# Patient Record
Sex: Male | Born: 1960 | Race: White | Hispanic: No | Marital: Married | Smoking: Never smoker
Health system: Southern US, Community
[De-identification: ages and names within clinical notes are randomized; demographics above are authoritative.]

## PROBLEM LIST (undated history)

## (undated) DIAGNOSIS — E785 Hyperlipidemia, unspecified: Secondary | ICD-10-CM

## (undated) DIAGNOSIS — N200 Calculus of kidney: Secondary | ICD-10-CM

## (undated) DIAGNOSIS — M1990 Unspecified osteoarthritis, unspecified site: Secondary | ICD-10-CM

## (undated) DIAGNOSIS — Z9289 Personal history of other medical treatment: Secondary | ICD-10-CM

## (undated) HISTORY — DX: Hyperlipidemia, unspecified: E78.5

## (undated) HISTORY — DX: Unspecified osteoarthritis, unspecified site: M19.90

## (undated) HISTORY — PX: OTHER SURGICAL HISTORY: SHX169

---

## 2007-06-21 ENCOUNTER — Inpatient Hospital Stay (HOSPITAL_COMMUNITY): Admission: EM | Admit: 2007-06-21 | Discharge: 2007-07-06 | Payer: Self-pay | Admitting: Emergency Medicine

## 2007-06-24 ENCOUNTER — Ambulatory Visit: Payer: Self-pay | Admitting: Physical Medicine & Rehabilitation

## 2008-01-27 ENCOUNTER — Encounter: Payer: Self-pay | Admitting: Internal Medicine

## 2008-02-09 ENCOUNTER — Ambulatory Visit: Payer: Self-pay | Admitting: Internal Medicine

## 2008-02-09 DIAGNOSIS — L03211 Cellulitis of face: Secondary | ICD-10-CM

## 2008-02-09 DIAGNOSIS — L0201 Cutaneous abscess of face: Secondary | ICD-10-CM

## 2008-02-09 DIAGNOSIS — F329 Major depressive disorder, single episode, unspecified: Secondary | ICD-10-CM

## 2008-02-20 ENCOUNTER — Ambulatory Visit: Payer: Self-pay | Admitting: Internal Medicine

## 2008-05-21 ENCOUNTER — Encounter: Admission: RE | Admit: 2008-05-21 | Discharge: 2008-05-21 | Payer: Self-pay | Admitting: Orthopedic Surgery

## 2008-06-14 ENCOUNTER — Ambulatory Visit: Payer: Self-pay | Admitting: *Deleted

## 2008-09-14 ENCOUNTER — Encounter: Admission: RE | Admit: 2008-09-14 | Discharge: 2008-09-14 | Payer: Self-pay | Admitting: Family Medicine
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
## 2010-10-16 LAB — TYPE AND SCREEN
ABO/RH(D): O POS
Antibody Screen: NEGATIVE

## 2010-10-16 LAB — POCT I-STAT 7, (LYTES, BLD GAS, ICA,H+H)
Calcium, Ion: 1.14
O2 Saturation: 100
Patient temperature: 36.5
Patient temperature: 37
Potassium: 4.1
Potassium: 4.4
TCO2: 26
TCO2: 28
pCO2 arterial: 44.2
pH, Arterial: 7.406
pO2, Arterial: 340 — ABNORMAL HIGH

## 2010-10-16 LAB — BASIC METABOLIC PANEL
BUN: 16
BUN: 16
CO2: 26
CO2: 27
CO2: 27
Calcium: 7.6 — ABNORMAL LOW
Calcium: 7.8 — ABNORMAL LOW
Calcium: 7.9 — ABNORMAL LOW
Calcium: 8.1 — ABNORMAL LOW
Calcium: 8.5
Calcium: 8.6
Chloride: 110
Chloride: 115 — ABNORMAL HIGH
Chloride: 101
Chloride: 103
Creatinine, Ser: 0.76
Creatinine, Ser: 0.86
Creatinine, Ser: 1
Creatinine, Ser: 1.14
GFR calc Af Amer: >60
GFR calc Af Amer: >60
GFR calc Af Amer: >60
GFR calc Af Amer: >60
GFR calc Af Amer: >60
GFR calc non Af Amer: >60
GFR calc non Af Amer: >60
GFR calc non Af Amer: >60
GFR calc non Af Amer: >60
GFR calc non Af Amer: >60
GFR calc non Af Amer: >60
Glucose, Bld: 104 — ABNORMAL HIGH
Glucose, Bld: 105 — ABNORMAL HIGH
Glucose, Bld: 107 — ABNORMAL HIGH
Glucose, Bld: 115 — ABNORMAL HIGH
Glucose, Bld: 113 — ABNORMAL HIGH
Glucose, Bld: 117 — ABNORMAL HIGH
Glucose, Bld: 118 — ABNORMAL HIGH
Potassium: 3.6
Potassium: 3.7
Potassium: 4.2
Potassium: 4.5
Potassium: 3.9
Potassium: 5
Sodium: 138
Sodium: 141
Sodium: 141
Sodium: 141
Sodium: 142
Sodium: 144
Sodium: 135
Sodium: 138

## 2010-10-16 LAB — CBC
HCT: 22 — ABNORMAL LOW
HCT: 26.8 — ABNORMAL LOW
HCT: 27.3 — ABNORMAL LOW
HCT: 30.2 — ABNORMAL LOW
HCT: 36.3 — ABNORMAL LOW
HCT: 43.5
HCT: 29.6 — ABNORMAL LOW
HCT: 29.7 — ABNORMAL LOW
Hemoglobin: 10.6 — ABNORMAL LOW
Hemoglobin: 11.1 — ABNORMAL LOW
Hemoglobin: 13.1
Hemoglobin: 15.1
Hemoglobin: 8.6 — ABNORMAL LOW
Hemoglobin: 8.7 — ABNORMAL LOW
Hemoglobin: 9.6 — ABNORMAL LOW
Hemoglobin: 9.9 — ABNORMAL LOW
Hemoglobin: 10.4 — ABNORMAL LOW
Hemoglobin: 10.6 — ABNORMAL LOW
MCHC: 34.7
MCHC: 34.8
MCHC: 34.8
MCHC: 34.9
MCHC: 35.7
MCHC: 36
MCHC: 34.3
MCV: 87.7
MCV: 87.7
MCV: 88.5
MCV: 88.5
MCV: 89
MCV: 89.2
MCV: 86.8
Platelets: 243
Platelets: 253
Platelets: 326
Platelets: 439 — ABNORMAL HIGH
Platelets: 507 — ABNORMAL HIGH
Platelets: 557 — ABNORMAL HIGH
Platelets: 562 — ABNORMAL HIGH
Platelets: 657 — ABNORMAL HIGH
Platelets: 701 — ABNORMAL HIGH
RBC: 2.65 — ABNORMAL LOW
RBC: 2.82 — ABNORMAL LOW
RBC: 2.96 — ABNORMAL LOW
RBC: 3 — ABNORMAL LOW
RBC: 3.08 — ABNORMAL LOW
RBC: 3.42 — ABNORMAL LOW
RBC: 3.72 — ABNORMAL LOW
RBC: 4.14 — ABNORMAL LOW
RBC: 4.88
RDW: 13.1
RDW: 13.1
RDW: 13.2
RDW: 13.2
RDW: 13.3
RDW: 13.6
RDW: 14.4
RDW: 14.7
RDW: 14.9
WBC: 10
WBC: 11.7 — ABNORMAL HIGH
WBC: 14.1 — ABNORMAL HIGH
WBC: 15.1 — ABNORMAL HIGH
WBC: 16.2 — ABNORMAL HIGH
WBC: 20 — ABNORMAL HIGH
WBC: 9.7
WBC: 16.9 — ABNORMAL HIGH
WBC: 17.5 — ABNORMAL HIGH

## 2010-10-16 LAB — CROSSMATCH: ABO/RH(D): O POS

## 2010-10-16 LAB — URINALYSIS, ROUTINE W REFLEX MICROSCOPIC
Bilirubin Urine: NEGATIVE
Nitrite: NEGATIVE
Specific Gravity, Urine: 1.024
Urobilinogen, UA: 1
pH: 6

## 2010-10-16 LAB — PROTIME-INR
INR: 1
INR: 1.1
INR: 1.1
Prothrombin Time: 13.5
Prothrombin Time: 14.5

## 2010-10-16 LAB — APTT: aPTT: 30

## 2010-10-16 LAB — COMPREHENSIVE METABOLIC PANEL
ALT: 81 — ABNORMAL HIGH
Alkaline Phosphatase: 103
BUN: 13
CO2: 26
Chloride: 106
Glucose, Bld: 111 — ABNORMAL HIGH
Potassium: 3.9
Sodium: 138
Total Bilirubin: 1.5 — ABNORMAL HIGH

## 2010-10-16 LAB — URINE MICROSCOPIC-ADD ON

## 2010-10-16 LAB — PREALBUMIN: Prealbumin: 11.6 — ABNORMAL LOW

## 2010-10-16 LAB — ABO/RH: ABO/RH(D): O POS

## 2014-11-10 ENCOUNTER — Encounter (HOSPITAL_COMMUNITY): Payer: Self-pay

## 2014-11-10 ENCOUNTER — Emergency Department (HOSPITAL_COMMUNITY)
Admission: EM | Admit: 2014-11-10 | Discharge: 2014-11-10 | Disposition: A | Discharge status: Home / Self Care | Payer: Medicare Other | Attending: Emergency Medicine | Admitting: Emergency Medicine

## 2014-11-10 ENCOUNTER — Emergency Department (HOSPITAL_COMMUNITY): Payer: Medicare Other

## 2014-11-10 DIAGNOSIS — Z7982 Long term (current) use of aspirin: Secondary | ICD-10-CM | POA: Insufficient documentation

## 2014-11-10 DIAGNOSIS — N1330 Unspecified hydronephrosis: Secondary | ICD-10-CM | POA: Diagnosis not present

## 2014-11-10 DIAGNOSIS — R110 Nausea: Secondary | ICD-10-CM | POA: Insufficient documentation

## 2014-11-10 DIAGNOSIS — N210 Calculus in bladder: Secondary | ICD-10-CM | POA: Diagnosis not present

## 2014-11-10 DIAGNOSIS — N200 Calculus of kidney: Secondary | ICD-10-CM | POA: Diagnosis not present

## 2014-11-10 DIAGNOSIS — Z79899 Other long term (current) drug therapy: Secondary | ICD-10-CM | POA: Diagnosis not present

## 2014-11-10 DIAGNOSIS — Z8659 Personal history of other mental and behavioral disorders: Secondary | ICD-10-CM | POA: Insufficient documentation

## 2014-11-10 DIAGNOSIS — R109 Unspecified abdominal pain: Secondary | ICD-10-CM | POA: Diagnosis present

## 2014-11-10 LAB — COMPREHENSIVE METABOLIC PANEL
ALK PHOS: 82 U/L (ref 38–126)
ALT: 31 U/L (ref 17–63)
AST: 29 U/L (ref 15–41)
Albumin: 4 g/dL (ref 3.5–5.0)
Anion gap: 16 — ABNORMAL HIGH (ref 5–15)
BILIRUBIN TOTAL: 1 mg/dL (ref 0.3–1.2)
BUN: 16 mg/dL (ref 6–20)
CALCIUM: 10 mg/dL (ref 8.9–10.3)
CO2: 20 mmol/L — AB (ref 22–32)
CREATININE: 1.23 mg/dL (ref 0.61–1.24)
Chloride: 106 mmol/L (ref 101–111)
Glucose, Bld: 146 mg/dL — ABNORMAL HIGH (ref 65–99)
Potassium: 3.8 mmol/L (ref 3.5–5.1)
Sodium: 142 mmol/L (ref 135–145)
TOTAL PROTEIN: 7.8 g/dL (ref 6.5–8.1)

## 2014-11-10 LAB — URINALYSIS, ROUTINE W REFLEX MICROSCOPIC
Glucose, UA: NEGATIVE mg/dL
Ketones, ur: 15 mg/dL — AB
Leukocytes, UA: NEGATIVE
Nitrite: NEGATIVE
PROTEIN: 30 mg/dL — AB
SPECIFIC GRAVITY, URINE: 1.026 (ref 1.005–1.030)
UROBILINOGEN UA: 1 mg/dL (ref 0.0–1.0)
pH: 5 (ref 5.0–8.0)

## 2014-11-10 LAB — URINE MICROSCOPIC-ADD ON

## 2014-11-10 LAB — CBC WITH DIFFERENTIAL/PLATELET
Basophils Absolute: 0 10*3/uL (ref 0.0–0.1)
Basophils Relative: 0 %
Eosinophils Absolute: 0.1 10*3/uL (ref 0.0–0.7)
Eosinophils Relative: 0 %
HEMATOCRIT: 47.3 % (ref 39.0–52.0)
HEMOGLOBIN: 16 g/dL (ref 13.0–17.0)
LYMPHS ABS: 1.4 10*3/uL (ref 0.7–4.0)
LYMPHS PCT: 10 %
MCH: 30.4 pg (ref 26.0–34.0)
MCHC: 33.8 g/dL (ref 30.0–36.0)
MCV: 89.9 fL (ref 78.0–100.0)
MONOS PCT: 6 %
Monocytes Absolute: 0.8 10*3/uL (ref 0.1–1.0)
NEUTROS PCT: 84 %
Neutro Abs: 11.3 10*3/uL — ABNORMAL HIGH (ref 1.7–7.7)
Platelets: 307 10*3/uL (ref 150–400)
RBC: 5.26 MIL/uL (ref 4.22–5.81)
RDW: 13.3 % (ref 11.5–15.5)
WBC: 13.5 10*3/uL — AB (ref 4.0–10.5)

## 2014-11-10 MED ORDER — HYDROMORPHONE HCL 1 MG/ML IJ SOLN
1.0000 mg | Freq: Once | INTRAMUSCULAR | Status: AC
  Administered 2014-11-10: 1 mg via INTRAVENOUS
  Filled 2014-11-10: qty 1

## 2014-11-10 MED ORDER — ONDANSETRON HCL 4 MG/2ML IJ SOLN
4.0000 mg | Freq: Once | INTRAMUSCULAR | Status: AC
  Administered 2014-11-10: 4 mg via INTRAVENOUS
  Filled 2014-11-10: qty 2

## 2014-11-10 MED ORDER — HYDROMORPHONE HCL 1 MG/ML IJ SOLN
0.5000 mg | Freq: Once | INTRAMUSCULAR | Status: AC
Start: 2014-11-10 — End: 2014-11-10
  Administered 2014-11-10: 0.5 mg via INTRAVENOUS
  Filled 2014-11-10: qty 1

## 2014-11-10 MED ORDER — SODIUM CHLORIDE 0.9 % IV BOLUS (SEPSIS)
1000.0000 mL | Freq: Once | INTRAVENOUS | Status: AC
Start: 2014-11-10 — End: 2014-11-10
  Administered 2014-11-10: 1000 mL via INTRAVENOUS

## 2014-11-10 MED ORDER — KETOROLAC TROMETHAMINE 30 MG/ML IJ SOLN
30.0000 mg | Freq: Once | INTRAMUSCULAR | Status: AC
  Administered 2014-11-10: 30 mg via INTRAVENOUS
  Filled 2014-11-10: qty 1

## 2014-11-10 MED ORDER — TAMSULOSIN HCL 0.4 MG PO CAPS
0.4000 mg | ORAL_CAPSULE | Freq: Every day | ORAL | Status: DC
Start: 2014-11-10 — End: 2016-03-02

## 2014-11-10 MED ORDER — OXYCODONE-ACETAMINOPHEN 5-325 MG PO TABS: 1.0000 | ORAL_TABLET | ORAL | Status: DC | PRN

## 2014-11-10 MED ORDER — OXYCODONE-ACETAMINOPHEN 5-325 MG PO TABS
1.0000 | ORAL_TABLET | Freq: Once | ORAL | Status: AC
  Administered 2014-11-10: 1 via ORAL
  Filled 2014-11-10: qty 1

## 2014-11-10 NOTE — ED Provider Notes (Signed)
CSN: [id number]     Arrival date & time 11/10/14  0609 History   First MD Initiated Contact with Patient 11/10/14 [phone]     Chief Complaint  Patient presents with  . Flank Pain     (Consider location/radiation/quality/duration/timing/severity/associated sxs/prior Treatment) HPI Patient presents with acute onset right flank pain radiating to the right side of the abdomen with onset at 3 AM on 10/21. Sates the pain has been episodic since then. Worsened this morning. Associated with nausea. Denies any fever or chills. Denies hematuria, urgency, frequency or dysuria. No previously similar symptoms. No family history of kidney stones. Past Medical History  Diagnosis Date  . Depression     hx of   . Kidney stone    Past Surgical History  Procedure Laterality Date  . Right arm fracture srugery     . Right wrist surgery     . Broken pelvis surgery       pin in middle to connect both sides   . Left heel surgery     . Left heel sugery       to remove screws    No family history on file. Social History  Substance Use Topics  . Smoking status: Never Smoker   . Smokeless tobacco: Never Used  . Alcohol Use: No    Review of Systems  Constitutional: Negative for fever and chills.  Respiratory: Negative for shortness of breath.   Cardiovascular: Negative for chest pain.  Gastrointestinal: Positive for nausea and abdominal pain. Negative for diarrhea and constipation.  Genitourinary: Positive for flank pain. Negative for dysuria, frequency, hematuria and difficulty urinating.  Musculoskeletal: Positive for back pain. Negative for neck pain and neck stiffness.  Skin: Negative for rash and wound.  Neurological: Negative for dizziness, weakness, light-headedness, numbness and headaches.  All other systems reviewed and are negative.     Allergies  Review of patient's allergies indicates no known allergies.  Home Medications   Prior to Admission medications   Medication Sig Start  Date End Date Taking? Authorizing Provider  aspirin EC 325 MG tablet Take 325 mg by mouth daily as needed (pain).    Historical Provider, MD  oxyCODONE-acetaminophen (PERCOCET/ROXICET) 5-325 MG tablet Take 1-2 tablets by mouth every 4 (four) hours as needed for severe pain. 11/10/14   Blanchie Dessert, MD  tamsulosin (FLOMAX) 0.4 MG CAPS capsule Take 1 capsule (0.4 mg total) by mouth daily after supper. 11/10/14   Blanchie Dessert, MD   BP 123/72 mmHg  Pulse 64  Temp(Src) 97.9 F (36.6 C) (Oral)  Resp 17  SpO2 97% Physical Exam  Constitutional: He is oriented to person, place, and time. He appears well-developed and well-nourished. He appears distressed.  HENT:  Head: Normocephalic and atraumatic.  Mouth/Throat: Oropharynx is clear and moist.  Eyes: EOM are normal. Pupils are equal, round, and reactive to light.  Neck: Normal range of motion. Neck supple.  Cardiovascular: Normal rate and regular rhythm.   Pulmonary/Chest: Effort normal and breath sounds normal. No respiratory distress. He has no wheezes. He has no rales.  Abdominal: Soft. Bowel sounds are normal. He exhibits no distension and no mass. There is no tenderness. There is no rebound and no guarding.  Musculoskeletal: Normal range of motion. He exhibits no edema or tenderness.  No CVA tenderness bilaterally.  Neurological: He is alert and oriented to person, place, and time.  Skin: Skin is warm and dry. No rash noted. No erythema.  Psychiatric: He has a normal mood and  affect. His behavior is normal.  Nursing note and vitals reviewed.   ED Course  Procedures (including critical care time) Labs Review Labs Reviewed  CBC WITH DIFFERENTIAL/PLATELET - Abnormal; Notable for the following:    WBC 13.5 (*)    Neutro Abs 11.3 (*)    All other components within normal limits  COMPREHENSIVE METABOLIC PANEL - Abnormal; Notable for the following:    CO2 20 (*)    Glucose, Bld 146 (*)    Anion gap 16 (*)    All other components  within normal limits  URINALYSIS, ROUTINE W REFLEX MICROSCOPIC (NOT AT [facility]) - Abnormal; Notable for the following:    Color, Urine AMBER (*)    APPearance TURBID (*)    Hgb urine dipstick LARGE (*)    Bilirubin Urine SMALL (*)    Ketones, ur 15 (*)    Protein, ur 30 (*)    All other components within normal limits  URINE MICROSCOPIC-ADD ON - Abnormal; Notable for the following:    Squamous Epithelial / LPF FEW (*)    All other components within normal limits    Imaging Review No results found. I have personally reviewed and evaluated these images and lab results as part of my medical decision-making.   EKG Interpretation None      MDM   Final diagnoses:  Kidney stone  Bladder stones    Pt presents with acute onset right flank pain associated with nausea. Consistent with renal stone. Will get CT without contrast and basic labs. Will attempt to control pain in the emergency department.  Signed out to oncoming emergency physician pending imaging and pain control.  Julianne Rice, MD 11/13/14 [phone]

## 2014-11-10 NOTE — ED Notes (Signed)
Pt c/o right-sided flank pain and nausea beginning 10/21 morning, denies burning w/ urination or blood in urine. No hx kidney stones.

## 2014-11-10 NOTE — ED Notes (Signed)
Patient transported to CT 

## 2014-11-10 NOTE — Discharge Instructions (Signed)
Kidney Stones °Kidney stones (urolithiasis) are deposits that form inside your kidneys. The intense pain is caused by the stone moving through the urinary tract. When the stone moves, the ureter goes into spasm around the stone. The stone is usually passed in the urine.  °CAUSES  °· A disorder that makes certain neck glands produce too much parathyroid hormone (primary hyperparathyroidism). °· A buildup of uric acid crystals, similar to gout in your joints. °· Narrowing (stricture) of the ureter. °· A kidney obstruction present at birth (congenital obstruction). °· Previous surgery on the kidney or ureters. °· Numerous kidney infections. °SYMPTOMS  °· Feeling sick to your stomach (nauseous). °· Throwing up (vomiting). °· Blood in the urine (hematuria). °· Pain that usually spreads (radiates) to the groin. °· Frequency or urgency of urination. °DIAGNOSIS  °· Taking a history and physical exam. °· Blood or urine tests. °· CT scan. °· Occasionally, an examination of the inside of the urinary bladder (cystoscopy) is performed. °TREATMENT  °· Observation. °· Increasing your fluid intake. °· Extracorporeal shock wave lithotripsy--This is a noninvasive procedure that uses shock waves to break up kidney stones. °· Surgery may be needed if you have severe pain or persistent obstruction. There are various surgical procedures. Most of the procedures are performed with the use of small instruments. Only small incisions are needed to accommodate these instruments, so recovery time is minimized. °The size, location, and chemical composition are all important variables that will determine the proper choice of action for you. Talk to your health care provider to better understand your situation so that you will minimize the risk of injury to yourself and your kidney.  °HOME CARE INSTRUCTIONS  °· Drink enough water and fluids to keep your urine clear or pale yellow. This will help you to pass the stone or stone fragments. °· Strain  all urine through the provided strainer. Keep all particulate matter and stones for your health care provider to see. The stone causing the pain may be as small as a grain of salt. It is very important to use the strainer each and every time you pass your urine. The collection of your stone will allow your health care provider to analyze it and verify that a stone has actually passed. The stone analysis will often identify what you can do to reduce the incidence of recurrences. °· Only take over-the-counter or prescription medicines for pain, discomfort, or fever as directed by your health care provider. °· Keep all follow-up visits as told by your health care provider. This is important. °· Get follow-up X-rays if required. The absence of pain does not always mean that the stone has passed. It may have only stopped moving. If the urine remains completely obstructed, it can cause loss of kidney function or even complete destruction of the kidney. It is your responsibility to make sure X-rays and follow-ups are completed. Ultrasounds of the kidney can show blockages and the status of the kidney. Ultrasounds are not associated with any radiation and can be performed easily in a matter of minutes. °· Make changes to your daily diet as told by your health care provider. You may be told to: °¨ Limit the amount of salt that you eat. °¨ Eat 5 or more servings of fruits and vegetables each day. °¨ Limit the amount of meat, poultry, fish, and eggs that you eat. °· Collect a 24-hour urine sample as told by your health care provider. You may need to collect another urine sample every 6-12   months. °SEEK MEDICAL CARE IF: °· You experience pain that is progressive and unresponsive to any pain medicine you have been prescribed. °SEEK IMMEDIATE MEDICAL CARE IF:  °· Pain cannot be controlled with the prescribed medicine. °· You have a fever or shaking chills. °· The severity or intensity of pain increases over 18 hours and is not  relieved by pain medicine. °· You develop a new onset of abdominal pain. °· You feel faint or pass out. °· You are unable to urinate. °  °This information is not intended to replace advice given to you by your health care provider. Make sure you discuss any questions you have with your health care provider. °  °Document Released: 01/05/2005 Document Revised: 09/26/2014 Document Reviewed: 06/08/2012 °Elsevier Interactive Patient Education ©2016 [employer] ° °

## 2014-11-10 NOTE — ED Provider Notes (Signed)
8:11 AM Pt with CT that showed 2 right sided stones in the ureter.  58mm distal and 68mm proximal with mild hydronephrosis and well as multiple bladder stones.  Pt has normal renal function and mild leukocytosis which is most likely acute phase reaction and UA that is pending. On re-eval pt's pain is improved but requesting some more meds.  Will give second dose of dilaudid and percocet.  If pain controlled discussing going home with flomax and pain control, using strainer and f/u with urology  10:22 AM UA without signs of infection. Patient's pain is controlled and he was discharged home.  Blanchie Dessert, MD 11/10/14 1023

## 2014-11-13 ENCOUNTER — Encounter (HOSPITAL_COMMUNITY): Payer: Self-pay | Admitting: *Deleted

## 2014-11-13 ENCOUNTER — Other Ambulatory Visit: Payer: Self-pay | Admitting: Urology

## 2014-11-13 DIAGNOSIS — N200 Calculus of kidney: Secondary | ICD-10-CM | POA: Diagnosis not present

## 2014-11-15 ENCOUNTER — Encounter (HOSPITAL_COMMUNITY): Payer: Self-pay

## 2014-11-15 ENCOUNTER — Ambulatory Visit (HOSPITAL_COMMUNITY): Payer: Medicare Other | Admitting: Anesthesiology

## 2014-11-15 ENCOUNTER — Encounter (HOSPITAL_COMMUNITY)
Admission: RE | Disposition: A | Discharge status: Home / Self Care | Payer: Self-pay | Source: Ambulatory Visit | Attending: Urology

## 2014-11-15 ENCOUNTER — Ambulatory Visit (HOSPITAL_COMMUNITY)
Admission: RE | Admit: 2014-11-15 | Discharge: 2014-11-15 | Disposition: A | Discharge status: Home / Self Care | Payer: Medicare Other | Source: Ambulatory Visit | Attending: Urology | Admitting: Urology

## 2014-11-15 DIAGNOSIS — N132 Hydronephrosis with renal and ureteral calculous obstruction: Secondary | ICD-10-CM | POA: Insufficient documentation

## 2014-11-15 DIAGNOSIS — Z87442 Personal history of urinary calculi: Secondary | ICD-10-CM | POA: Diagnosis not present

## 2014-11-15 DIAGNOSIS — N200 Calculus of kidney: Secondary | ICD-10-CM | POA: Diagnosis not present

## 2014-11-15 DIAGNOSIS — N210 Calculus in bladder: Secondary | ICD-10-CM | POA: Insufficient documentation

## 2014-11-15 DIAGNOSIS — Z6832 Body mass index (BMI) 32.0-32.9, adult: Secondary | ICD-10-CM | POA: Diagnosis not present

## 2014-11-15 DIAGNOSIS — N201 Calculus of ureter: Secondary | ICD-10-CM | POA: Diagnosis not present

## 2014-11-15 DIAGNOSIS — N359 Urethral stricture, unspecified: Secondary | ICD-10-CM | POA: Insufficient documentation

## 2014-11-15 DIAGNOSIS — E669 Obesity, unspecified: Secondary | ICD-10-CM | POA: Insufficient documentation

## 2014-11-15 HISTORY — PX: CYSTOSCOPY WITH RETROGRADE PYELOGRAM, URETEROSCOPY AND STENT PLACEMENT: SHX5789

## 2014-11-15 HISTORY — DX: Calculus of kidney: N20.0

## 2014-11-15 SURGERY — CYSTOURETEROSCOPY, WITH RETROGRADE PYELOGRAM AND STENT INSERTION
Anesthesia: General | Site: Ureter | Laterality: Right

## 2014-11-15 MED ORDER — FENTANYL CITRATE (PF) 250 MCG/5ML IJ SOLN
INTRAMUSCULAR | Status: AC
  Filled 2014-11-15: qty 25

## 2014-11-15 MED ORDER — SODIUM CHLORIDE 0.9 % IR SOLN
Status: DC | PRN
  Administered 2014-11-15: 4000 mL

## 2014-11-15 MED ORDER — DEXAMETHASONE SODIUM PHOSPHATE 10 MG/ML IJ SOLN
INTRAMUSCULAR | Status: AC
  Filled 2014-11-15: qty 1

## 2014-11-15 MED ORDER — FENTANYL CITRATE (PF) 100 MCG/2ML IJ SOLN
INTRAMUSCULAR | Status: DC | PRN
Start: 2014-11-15 — End: 2014-11-15
  Administered 2014-11-15 (×5): 50 ug via INTRAVENOUS

## 2014-11-15 MED ORDER — GLYCOPYRROLATE 0.2 MG/ML IJ SOLN
INTRAMUSCULAR | Status: AC
  Filled 2014-11-15: qty 3

## 2014-11-15 MED ORDER — ACETAMINOPHEN 10 MG/ML IV SOLN
INTRAVENOUS | Status: DC | PRN
  Administered 2014-11-15: 1000 mg via INTRAVENOUS

## 2014-11-15 MED ORDER — MIDAZOLAM HCL 5 MG/5ML IJ SOLN
INTRAMUSCULAR | Status: DC | PRN
Start: 2014-11-15 — End: 2014-11-15
  Administered 2014-11-15 (×2): 1 mg via INTRAVENOUS

## 2014-11-15 MED ORDER — IOHEXOL 300 MG/ML  SOLN
INTRAMUSCULAR | Status: DC | PRN
  Administered 2014-11-15: 10 mL

## 2014-11-15 MED ORDER — LACTATED RINGERS IV SOLN
INTRAVENOUS | Status: DC | PRN
  Administered 2014-11-15 (×2): via INTRAVENOUS

## 2014-11-15 MED ORDER — ONDANSETRON HCL 4 MG/2ML IJ SOLN
INTRAMUSCULAR | Status: AC
  Filled 2014-11-15: qty 2

## 2014-11-15 MED ORDER — GENTAMICIN IN SALINE 1.6-0.9 MG/ML-% IV SOLN: 80.0000 mg | INTRAVENOUS | Status: DC

## 2014-11-15 MED ORDER — ACETAMINOPHEN 10 MG/ML IV SOLN
INTRAVENOUS | Status: AC
  Filled 2014-11-15: qty 100

## 2014-11-15 MED ORDER — DEXAMETHASONE SODIUM PHOSPHATE 10 MG/ML IJ SOLN
INTRAMUSCULAR | Status: DC | PRN
  Administered 2014-11-15: 10 mg via INTRAVENOUS

## 2014-11-15 MED ORDER — METOCLOPRAMIDE HCL 5 MG/ML IJ SOLN
INTRAMUSCULAR | Status: DC | PRN
  Administered 2014-11-15: 10 mg via INTRAVENOUS

## 2014-11-15 MED ORDER — ONDANSETRON HCL 4 MG/2ML IJ SOLN
INTRAMUSCULAR | Status: DC | PRN
  Administered 2014-11-15: 4 mg via INTRAVENOUS

## 2014-11-15 MED ORDER — PROPOFOL 10 MG/ML IV BOLUS
INTRAVENOUS | Status: DC | PRN
  Administered 2014-11-15: 200 mg via INTRAVENOUS

## 2014-11-15 MED ORDER — 0.9 % SODIUM CHLORIDE (POUR BTL) OPTIME
TOPICAL | Status: DC | PRN
  Administered 2014-11-15: 1000 mL

## 2014-11-15 MED ORDER — KETOROLAC TROMETHAMINE 30 MG/ML IJ SOLN
INTRAMUSCULAR | Status: DC | PRN
  Administered 2014-11-15: 30 mg via INTRAVENOUS

## 2014-11-15 MED ORDER — LACTATED RINGERS IV SOLN: INTRAVENOUS | Status: DC

## 2014-11-15 MED ORDER — LIDOCAINE HCL (CARDIAC) 20 MG/ML IV SOLN
INTRAVENOUS | Status: AC
  Filled 2014-11-15: qty 5

## 2014-11-15 MED ORDER — PROPOFOL 10 MG/ML IV BOLUS
INTRAVENOUS | Status: AC
  Filled 2014-11-15: qty 20

## 2014-11-15 MED ORDER — LIDOCAINE HCL (CARDIAC) 10 MG/ML IV SOLN
INTRAVENOUS | Status: DC | PRN
  Administered 2014-11-15: 100 mg via INTRAVENOUS

## 2014-11-15 MED ORDER — FENTANYL CITRATE (PF) 100 MCG/2ML IJ SOLN: 25.0000 ug | INTRAMUSCULAR | Status: DC | PRN

## 2014-11-15 MED ORDER — MIDAZOLAM HCL 2 MG/2ML IJ SOLN
INTRAMUSCULAR | Status: AC
  Filled 2014-11-15: qty 4

## 2014-11-15 MED ORDER — PROMETHAZINE HCL 25 MG/ML IJ SOLN: 6.2500 mg | INTRAMUSCULAR | Status: DC | PRN

## 2014-11-15 MED ORDER — GENTAMICIN SULFATE 40 MG/ML IJ SOLN
420.0000 mg | INTRAVENOUS | Status: AC
  Administered 2014-11-15: 420 mg via INTRAVENOUS
  Filled 2014-11-15: qty 10.5

## 2014-11-15 MED ORDER — NEOSTIGMINE METHYLSULFATE 10 MG/10ML IV SOLN
INTRAVENOUS | Status: AC
  Filled 2014-11-15: qty 1

## 2014-11-15 SURGICAL SUPPLY — 23 items
BASKET LASER NITINOL 1.9FR (BASKET) ×4 IMPLANT
BASKET STNLS GEMINI 4WIRE 3FR (BASKET) IMPLANT
BASKET ZERO TIP NITINOL 2.4FR (BASKET) IMPLANT
CATH INTERMIT  6FR 70CM (CATHETERS) ×4 IMPLANT
CLOTH BEACON ORANGE TIMEOUT ST (SAFETY) ×4 IMPLANT
ELECT REM PT RETURN 9FT ADLT (ELECTROSURGICAL)
ELECTRODE REM PT RTRN 9FT ADLT (ELECTROSURGICAL) IMPLANT
FIBER LASER FLEXIVA 1000 (UROLOGICAL SUPPLIES) IMPLANT
FIBER LASER FLEXIVA 200 (UROLOGICAL SUPPLIES) IMPLANT
FIBER LASER FLEXIVA 365 (UROLOGICAL SUPPLIES) IMPLANT
FIBER LASER FLEXIVA 550 (UROLOGICAL SUPPLIES) IMPLANT
FIBER LASER TRAC TIP (UROLOGICAL SUPPLIES) IMPLANT
GLOVE BIOGEL M STRL SZ7.5 (GLOVE) ×4 IMPLANT
GOWN STRL REUS W/TWL XL LVL3 (GOWN DISPOSABLE) ×8 IMPLANT
GUIDEWIRE ANG ZIPWIRE 038X150 (WIRE) ×4 IMPLANT
GUIDEWIRE STR DUAL SENSOR (WIRE) ×4 IMPLANT
IV NS IRRIG 3000ML ARTHROMATIC (IV SOLUTION) ×4 IMPLANT
PACK CYSTO (CUSTOM PROCEDURE TRAY) ×4 IMPLANT
SHEATH ACCESS URETERAL 38CM (SHEATH) ×4 IMPLANT
STENT POLARIS 5FRX26 (STENTS) ×4 IMPLANT
SYRINGE 10CC LL (SYRINGE) IMPLANT
SYRINGE IRR TOOMEY STRL 70CC (SYRINGE) IMPLANT
TUBE FEEDING 8FR 16IN STR KANG (MISCELLANEOUS) ×4 IMPLANT

## 2014-11-15 NOTE — Transfer of Care (Signed)
Immediate Anesthesia Transfer of Care Note  Patient: Walter Koch  Procedure(s) Performed: Procedure(s): CYSTOSCOPY WITH RETROGRADE PYELOGRAM, URETEROSCOPY AND STENT PLACEMENT right side basket extraction stone dilation of urethra (Right)  Patient Location: PACU  Anesthesia Type:General  Level of Consciousness: awake, alert , oriented and patient cooperative  Airway & Oxygen Therapy: Patient Spontanous Breathing and Patient connected to face mask oxygen  Post-op Assessment: Report given to RN, Post -op Vital signs reviewed and stable and Patient moving all extremities X 4  Post vital signs: stable  Last Vitals:  Filed Vitals:   11/15/14 1559  BP: 136/80  Pulse: 87  Temp:   Resp:     Complications: No apparent anesthesia complications

## 2014-11-15 NOTE — Discharge Instructions (Signed)
1 - You may have urinary urgency (bladder spasms) and bloody urine on / off with stent in place. This is normal. ° °2 - Call MD or go to ER for fever >102, severe pain / nausea / vomiting not relieved by medications, or acute change in medical status ° °

## 2014-11-15 NOTE — Anesthesia Procedure Notes (Signed)
Procedure Name: LMA Insertion Date/Time: 11/15/2014 3:14 PM Performed by: Enrigue Catena E Pre-anesthesia Checklist: Patient identified, Emergency Drugs available, Suction available and Patient being monitored Patient Re-evaluated:Patient Re-evaluated prior to inductionOxygen Delivery Method: Circle System Utilized Preoxygenation: Pre-oxygenation with 100% oxygen Intubation Type: IV induction Ventilation: Mask ventilation without difficulty LMA: LMA with gastric port inserted LMA Size: 5.0 Tube type: Oral (sump into gastric port of LMA and stomach decompressed) Number of attempts: 1 Placement Confirmation: positive ETCO2 Tube secured with: Tape Dental Injury: Teeth and Oropharynx as per pre-operative assessment

## 2014-11-15 NOTE — Brief Op Note (Signed)
11/15/2014  3:49 PM  PATIENT:  Walter Koch  54 y.o. male  PRE-OPERATIVE DIAGNOSIS:  RIGHT URETERAL STONE  POST-OPERATIVE DIAGNOSIS:  right ureteral stone  PROCEDURE:  Procedure(s): CYSTOSCOPY WITH RETROGRADE PYELOGRAM, URETEROSCOPY AND STENT PLACEMENT right side basket extraction stone dilation of urethra (Right)  SURGEON:  Surgeon(s) and Role:    * Alexis Frock, MD - Primary  PHYSICIAN ASSISTANT:   ASSISTANTS: none   ANESTHESIA:   general  EBL:     BLOOD ADMINISTERED:none  DRAINS: none   LOCAL MEDICATIONS USED:  NONE  SPECIMEN:  Source of Specimen:  right distal ureteral stone  DISPOSITION OF SPECIMEN:  [facility] for compositional analysis  COUNTS:  YES  TOURNIQUET:  * No tourniquets in log *  DICTATION: .Other Dictation: Dictation Number  [id number]*  PLAN OF CARE: Discharge to home after PACU  PATIENT DISPOSITION:  PACU - hemodynamically stable.   Delay start of Pharmacological VTE agent (>24hrs) due to surgical blood loss or risk of bleeding: yes

## 2014-11-15 NOTE — Anesthesia Preprocedure Evaluation (Signed)
Anesthesia Evaluation  Patient identified by MRN, date of birth, ID band Patient awake    Reviewed: Allergy & Precautions, NPO status , Patient's Chart, lab work & pertinent test results  Airway Mallampati: II  TM Distance: >3 FB Neck ROM: Full    Dental no notable dental hx.    Pulmonary neg pulmonary ROS,    Pulmonary exam normal breath sounds clear to auscultation       Cardiovascular Exercise Tolerance: Good negative cardio ROS Normal cardiovascular exam Rhythm:Regular Rate:Normal     Neuro/Psych PSYCHIATRIC DISORDERS Depression negative neurological ROS     GI/Hepatic negative GI ROS, Neg liver ROS,   Endo/Other  negative endocrine ROS  Renal/GU Renal disease  negative genitourinary   Musculoskeletal negative musculoskeletal ROS (+)   Abdominal (+) + obese,   Peds negative pediatric ROS (+)  Hematology negative hematology ROS (+)   Anesthesia Other Findings   Reproductive/Obstetrics negative OB ROS                             Anesthesia Physical Anesthesia Plan  ASA: II  Anesthesia Plan: General   Post-op Pain Management:    Induction: Intravenous  Airway Management Planned: LMA  Additional Equipment:   Intra-op Plan:   Post-operative Plan: Extubation in OR  Informed Consent: I have reviewed the patients History and Physical, chart, labs and discussed the procedure including the risks, benefits and alternatives for the proposed anesthesia with the patient or authorized representative who has indicated his/her understanding and acceptance.   Dental advisory given  Plan Discussed with: CRNA  Anesthesia Plan Comments:         Anesthesia Quick Evaluation

## 2014-11-15 NOTE — Anesthesia Postprocedure Evaluation (Signed)
  Anesthesia Post-op Note  Patient: Walter Koch  Procedure(s) Performed: Procedure(s) (LRB): CYSTOSCOPY WITH RETROGRADE PYELOGRAM, URETEROSCOPY AND STENT PLACEMENT right side basket extraction stone dilation of urethra (Right)  Patient Location: PACU  Anesthesia Type: General  Level of Consciousness: awake and alert   Airway and Oxygen Therapy: Patient Spontanous Breathing  Post-op Pain: mild  Post-op Assessment: Post-op Vital signs reviewed, Patient's Cardiovascular Status Stable, Respiratory Function Stable, Patent Airway and No signs of Nausea or vomiting  Last Vitals:  Filed Vitals:   11/15/14 1645  BP: 144/84  Pulse: 80  Temp: 36.9 C  Resp: 16    Post-op Vital Signs: stable   Complications: No apparent anesthesia complications
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
## 2014-11-16 ENCOUNTER — Other Ambulatory Visit: Payer: Self-pay | Admitting: Urology
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
## 2014-12-03 DIAGNOSIS — N200 Calculus of kidney: Secondary | ICD-10-CM | POA: Diagnosis not present

## 2014-12-03 DIAGNOSIS — N210 Calculus in bladder: Secondary | ICD-10-CM | POA: Diagnosis not present

## 2014-12-28 NOTE — Patient Instructions (Addendum)
YOUR PROCEDURE IS SCHEDULED ON :  01/02/15  REPORT TO [facility] MAIN ENTRANCE FOLLOW SIGNS TO EAST ELEVATOR - GO TO 3rd FLOOR CHECK IN AT 3 EAST NURSES STATION (SHORT STAY) AT:  11:15 AM  CALL THIS NUMBER IF YOU HAVE PROBLEMS THE MORNING OF SURGERY [phone]  REMEMBER:ONLY 1 PER PERSON MAY GO TO SHORT STAY WITH YOU TO GET READY THE MORNING OF YOUR SURGERY  DO NOT EAT FOOD  AFTER MIDNIGHT  MAY HAVE CLEAR LIQUIDS UNTIL 7:00AM  TAKE THESE MEDICINES THE MORNING OF SURGERY: FLOMAX  CLEAR LIQUID DIET  Foods Allowed                                                                     Foods Excluded  Coffee and tea, regular and decaf                             liquids that you cannot  Plain Jell-O in any flavor                                             see through such as: Fruit ices (not with fruit pulp)                                     milk, soups, orange juice  Iced Popsicles                                                         All solid food Carbonated beverages, regular and diet                                    Cranberry, grape and apple juices Sports drinks like Gatorade Lightly seasoned clear broth or consume(fat free) Sugar, honey syrup   _____________________________________________________________________    YOU MAY NOT HAVE ANY METAL ON YOUR BODY INCLUDING HAIR PINS AND PIERCING'S. DO NOT WEAR JEWELRY, MAKEUP, LOTIONS, POWDERS OR PERFUMES. DO NOT WEAR NAIL POLISH. DO NOT SHAVE 48 HRS PRIOR TO SURGERY. MEN MAY SHAVE FACE AND NECK.  DO NOT [facility] IS NOT RESPONSIBLE FOR VALUABLES.  CONTACTS, DENTURES OR PARTIALS MAY NOT BE WORN TO SURGERY. LEAVE SUITCASE IN CAR. CAN BE BROUGHT TO ROOM AFTER SURGERY.  PATIENTS DISCHARGED THE DAY OF SURGERY WILL NOT BE ALLOWED TO DRIVE HOME.  PLEASE READ OVER THE FOLLOWING INSTRUCTION  SHEETS _________________________________________________________________________________                                          [facility] - PREPARING FOR SURGERY  Before surgery, you can play an important role.  Because skin is not sterile, your skin needs to be as free of germs as possible.  You can reduce the number of germs on your skin by washing with CHG (chlorahexidine gluconate) soap before surgery.  CHG is an antiseptic cleaner which kills germs and bonds with the skin to continue killing germs even after washing. Please DO NOT use if you have an allergy to CHG or antibacterial soaps.  If your skin becomes reddened/irritated stop using the CHG and inform your nurse when you arrive at Short Stay. Do not shave (including legs and underarms) for at least 48 hours prior to the first CHG shower.  You may shave your face. Please follow these instructions carefully:   1.  Shower with CHG Soap the night before surgery and the  morning of Surgery.   2.  If you choose to wash your hair, wash your hair first as usual with your  normal  Shampoo.   3.  After you shampoo, rinse your hair and body thoroughly to remove the  shampoo.                                         4.  Use CHG as you would any other liquid soap.  You can apply chg directly  to the skin and wash . Gently wash with scrungie or clean wascloth    5.  Apply the CHG Soap to your body ONLY FROM THE NECK DOWN.   Do not use on open                           Wound or open sores. Avoid contact with eyes, ears mouth and genitals (private parts).                        Genitals (private parts) with your normal soap.              6.  Wash thoroughly, paying special attention to the area where your surgery  will be performed.   7.  Thoroughly rinse your body with warm water from the neck down.   8.  DO NOT shower/wash with your normal soap after using and rinsing off  the CHG Soap .                9.  Pat yourself dry with a clean  towel.             10.  Wear clean night clothes to bed after shower             11.  Place clean sheets on your bed the night of your first shower and do not  sleep with pets.  Day of Surgery : Do not apply any lotions/deodorants the morning of surgery.  Please wear clean clothes to the hospital/surgery center.  FAILURE TO FOLLOW THESE INSTRUCTIONS MAY RESULT IN THE CANCELLATION OF YOUR SURGERY    PATIENT SIGNATURE_________________________________  ______________________________________________________________________

## 2014-12-31 ENCOUNTER — Encounter (HOSPITAL_COMMUNITY): Payer: Self-pay

## 2014-12-31 ENCOUNTER — Encounter (HOSPITAL_COMMUNITY)
Admission: RE | Admit: 2014-12-31 | Discharge: 2014-12-31 | Disposition: A | Discharge status: Home / Self Care | Payer: Medicare Other | Source: Ambulatory Visit | Attending: Urology | Admitting: Urology

## 2014-12-31 DIAGNOSIS — N210 Calculus in bladder: Secondary | ICD-10-CM | POA: Diagnosis present

## 2014-12-31 DIAGNOSIS — N359 Urethral stricture, unspecified: Secondary | ICD-10-CM | POA: Diagnosis not present

## 2014-12-31 DIAGNOSIS — N201 Calculus of ureter: Principal | ICD-10-CM | POA: Diagnosis present

## 2014-12-31 HISTORY — DX: Personal history of other medical treatment: Z92.89

## 2014-12-31 LAB — BASIC METABOLIC PANEL
Anion gap: 8 (ref 5–15)
BUN: 14 mg/dL (ref 6–20)
CALCIUM: 9.3 mg/dL (ref 8.9–10.3)
CO2: 26 mmol/L (ref 22–32)
CREATININE: 0.96 mg/dL (ref 0.61–1.24)
Chloride: 109 mmol/L (ref 101–111)
Glucose, Bld: 128 mg/dL — ABNORMAL HIGH (ref 65–99)
Potassium: 4.1 mmol/L (ref 3.5–5.1)
SODIUM: 143 mmol/L (ref 135–145)

## 2014-12-31 LAB — CBC
HEMATOCRIT: 43.5 % (ref 39.0–52.0)
Hemoglobin: 14.4 g/dL (ref 13.0–17.0)
MCH: 29.4 pg (ref 26.0–34.0)
MCHC: 33.1 g/dL (ref 30.0–36.0)
MCV: 89 fL (ref 78.0–100.0)
PLATELETS: 265 10*3/uL (ref 150–400)
RBC: 4.89 MIL/uL (ref 4.22–5.81)
RDW: 13.3 % (ref 11.5–15.5)
WBC: 6.1 10*3/uL (ref 4.0–10.5)

## 2015-01-01 MED ORDER — GENTAMICIN SULFATE 40 MG/ML IJ SOLN
5.0000 mg/kg | INTRAVENOUS | Status: AC
  Administered 2015-01-02: 440 mg via INTRAVENOUS
  Filled 2015-01-01: qty 11

## 2015-01-02 ENCOUNTER — Encounter (HOSPITAL_COMMUNITY): Payer: Self-pay | Admitting: *Deleted

## 2015-01-02 ENCOUNTER — Inpatient Hospital Stay (HOSPITAL_COMMUNITY): Payer: Medicare Other

## 2015-01-02 ENCOUNTER — Inpatient Hospital Stay (HOSPITAL_COMMUNITY): Payer: Medicare Other | Admitting: Certified Registered"

## 2015-01-02 ENCOUNTER — Inpatient Hospital Stay (HOSPITAL_COMMUNITY)
Admission: RE | Admit: 2015-01-02 | Discharge: 2015-01-03 | DRG: 664 | Disposition: A | Discharge status: Home / Self Care | Payer: Medicare Other | Source: Ambulatory Visit | Attending: Urology | Admitting: Urology

## 2015-01-02 ENCOUNTER — Encounter (HOSPITAL_COMMUNITY)
Admission: RE | Disposition: A | Discharge status: Home / Self Care | Payer: Self-pay | Source: Ambulatory Visit | Attending: Urology

## 2015-01-02 DIAGNOSIS — Z419 Encounter for procedure for purposes other than remedying health state, unspecified: Secondary | ICD-10-CM

## 2015-01-02 DIAGNOSIS — N210 Calculus in bladder: Secondary | ICD-10-CM | POA: Diagnosis present

## 2015-01-02 DIAGNOSIS — N201 Calculus of ureter: Secondary | ICD-10-CM | POA: Diagnosis present

## 2015-01-02 DIAGNOSIS — N359 Urethral stricture, unspecified: Secondary | ICD-10-CM | POA: Diagnosis not present

## 2015-01-02 HISTORY — PX: CYSTOSCOPY WITH RETROGRADE PYELOGRAM, URETEROSCOPY AND STENT PLACEMENT: SHX5789

## 2015-01-02 HISTORY — PX: ROBOT ASSISTED LAPAROSCOPIC RADICAL PROSTATECTOMY: SHX5141

## 2015-01-02 HISTORY — PX: HOLMIUM LASER APPLICATION: SHX5852

## 2015-01-02 LAB — HEMOGLOBIN AND HEMATOCRIT, BLOOD
HEMATOCRIT: 42.3 % (ref 39.0–52.0)
HEMOGLOBIN: 14.1 g/dL (ref 13.0–17.0)

## 2015-01-02 SURGERY — ROBOTIC ASSISTED LAPAROSCOPIC RADICAL PROSTATECTOMY
Anesthesia: General | Laterality: Right

## 2015-01-02 MED ORDER — SODIUM CHLORIDE 0.9 % IV BOLUS (SEPSIS)
1000.0000 mL | Freq: Once | INTRAVENOUS | Status: AC
  Administered 2015-01-02: 1000 mL via INTRAVENOUS

## 2015-01-02 MED ORDER — ONDANSETRON HCL 4 MG/2ML IJ SOLN: 4.0000 mg | INTRAMUSCULAR | Status: DC | PRN

## 2015-01-02 MED ORDER — HYDROMORPHONE HCL 1 MG/ML IJ SOLN
0.2500 mg | INTRAMUSCULAR | Status: DC | PRN
  Administered 2015-01-02 (×2): 0.5 mg via INTRAVENOUS

## 2015-01-02 MED ORDER — SENNA 8.6 MG PO TABS: 1.0000 | ORAL_TABLET | Freq: Every day | ORAL | Status: DC | PRN

## 2015-01-02 MED ORDER — NEOSTIGMINE METHYLSULFATE 10 MG/10ML IV SOLN
INTRAVENOUS | Status: DC | PRN
  Administered 2015-01-02: 4 mg via INTRAVENOUS

## 2015-01-02 MED ORDER — DIPHENHYDRAMINE HCL 12.5 MG/5ML PO ELIX: 12.5000 mg | ORAL_SOLUTION | Freq: Four times a day (QID) | ORAL | Status: DC | PRN

## 2015-01-02 MED ORDER — PHENYLEPHRINE 40 MCG/ML (10ML) SYRINGE FOR IV PUSH (FOR BLOOD PRESSURE SUPPORT)
PREFILLED_SYRINGE | INTRAVENOUS | Status: AC
  Filled 2015-01-02: qty 10

## 2015-01-02 MED ORDER — ROCURONIUM BROMIDE 100 MG/10ML IV SOLN
INTRAVENOUS | Status: AC
  Filled 2015-01-02: qty 1

## 2015-01-02 MED ORDER — LIDOCAINE HCL (PF) 2 % IJ SOLN
INTRAMUSCULAR | Status: DC | PRN
  Administered 2015-01-02: 30 mg via INTRADERMAL

## 2015-01-02 MED ORDER — ROCURONIUM BROMIDE 100 MG/10ML IV SOLN
INTRAVENOUS | Status: DC | PRN
  Administered 2015-01-02 (×2): 10 mg via INTRAVENOUS
  Administered 2015-01-02: 50 mg via INTRAVENOUS

## 2015-01-02 MED ORDER — LACTATED RINGERS IV SOLN: INTRAVENOUS | Status: DC

## 2015-01-02 MED ORDER — OXYCODONE-ACETAMINOPHEN 5-325 MG PO TABS: 1.0000 | ORAL_TABLET | Freq: Four times a day (QID) | ORAL | Status: DC | PRN

## 2015-01-02 MED ORDER — SULFAMETHOXAZOLE-TRIMETHOPRIM 800-160 MG PO TABS: 1.0000 | ORAL_TABLET | Freq: Two times a day (BID) | ORAL | Status: DC

## 2015-01-02 MED ORDER — HYDROMORPHONE HCL 1 MG/ML IJ SOLN
INTRAMUSCULAR | Status: DC | PRN
  Administered 2015-01-02 (×3): 0.5 mg via INTRAVENOUS

## 2015-01-02 MED ORDER — SODIUM CHLORIDE 0.9 % IR SOLN
Status: DC | PRN
  Administered 2015-01-02: 5000 mL

## 2015-01-02 MED ORDER — ONDANSETRON HCL 4 MG/2ML IJ SOLN
INTRAMUSCULAR | Status: AC
  Filled 2015-01-02: qty 2

## 2015-01-02 MED ORDER — FENTANYL CITRATE (PF) 100 MCG/2ML IJ SOLN
INTRAMUSCULAR | Status: DC | PRN
  Administered 2015-01-02 (×2): 50 ug via INTRAVENOUS
  Administered 2015-01-02: 100 ug via INTRAVENOUS
  Administered 2015-01-02: 50 ug via INTRAVENOUS
  Administered 2015-01-02: 100 ug via INTRAVENOUS

## 2015-01-02 MED ORDER — FENTANYL CITRATE (PF) 250 MCG/5ML IJ SOLN
INTRAMUSCULAR | Status: AC
  Filled 2015-01-02: qty 5

## 2015-01-02 MED ORDER — ACETAMINOPHEN 500 MG PO TABS
1000.0000 mg | ORAL_TABLET | Freq: Four times a day (QID) | ORAL | Status: AC
  Administered 2015-01-02 – 2015-01-03 (×4): 1000 mg via ORAL
  Filled 2015-01-02 (×6): qty 2

## 2015-01-02 MED ORDER — PHENYLEPHRINE HCL 10 MG/ML IJ SOLN
INTRAMUSCULAR | Status: DC | PRN
  Administered 2015-01-02: 80 ug via INTRAVENOUS

## 2015-01-02 MED ORDER — CEFAZOLIN SODIUM-DEXTROSE 2-3 GM-% IV SOLR
INTRAVENOUS | Status: AC
  Filled 2015-01-02: qty 50

## 2015-01-02 MED ORDER — MIDAZOLAM HCL 2 MG/2ML IJ SOLN
INTRAMUSCULAR | Status: AC
  Filled 2015-01-02: qty 2

## 2015-01-02 MED ORDER — DIPHENHYDRAMINE HCL 50 MG/ML IJ SOLN: 12.5000 mg | Freq: Four times a day (QID) | INTRAMUSCULAR | Status: DC | PRN

## 2015-01-02 MED ORDER — HYDROMORPHONE HCL 1 MG/ML IJ SOLN: 0.5000 mg | INTRAMUSCULAR | Status: DC | PRN

## 2015-01-02 MED ORDER — DEXAMETHASONE SODIUM PHOSPHATE 10 MG/ML IJ SOLN
INTRAMUSCULAR | Status: AC
  Filled 2015-01-02: qty 2

## 2015-01-02 MED ORDER — DEXTROSE-NACL 5-0.45 % IV SOLN
INTRAVENOUS | Status: DC
  Administered 2015-01-02 – 2015-01-03 (×3): via INTRAVENOUS

## 2015-01-02 MED ORDER — OXYCODONE HCL 5 MG PO TABS
5.0000 mg | ORAL_TABLET | ORAL | Status: DC | PRN
  Administered 2015-01-03 (×2): 5 mg via ORAL
  Filled 2015-01-02 (×2): qty 1

## 2015-01-02 MED ORDER — GLYCOPYRROLATE 0.2 MG/ML IJ SOLN
INTRAMUSCULAR | Status: DC | PRN
  Administered 2015-01-02: 0.3 mg via INTRAVENOUS
  Administered 2015-01-02: 0.6 mg via INTRAVENOUS

## 2015-01-02 MED ORDER — MIDAZOLAM HCL 5 MG/5ML IJ SOLN
INTRAMUSCULAR | Status: DC | PRN
  Administered 2015-01-02: 2 mg via INTRAVENOUS

## 2015-01-02 MED ORDER — BUPIVACAINE LIPOSOME 1.3 % IJ SUSP
20.0000 mL | Freq: Once | INTRAMUSCULAR | Status: AC
  Administered 2015-01-02: 20 mL
  Filled 2015-01-02: qty 20

## 2015-01-02 MED ORDER — TAMSULOSIN HCL 0.4 MG PO CAPS
0.4000 mg | ORAL_CAPSULE | Freq: Every day | ORAL | Status: DC
  Administered 2015-01-03: 0.4 mg via ORAL
  Filled 2015-01-02: qty 1

## 2015-01-02 MED ORDER — HYDROMORPHONE HCL 2 MG/ML IJ SOLN
INTRAMUSCULAR | Status: AC
  Filled 2015-01-02: qty 1

## 2015-01-02 MED ORDER — SUCCINYLCHOLINE CHLORIDE 20 MG/ML IJ SOLN
INTRAMUSCULAR | Status: DC | PRN
  Administered 2015-01-02: 100 mg via INTRAVENOUS

## 2015-01-02 MED ORDER — PROPOFOL 10 MG/ML IV BOLUS
INTRAVENOUS | Status: AC
  Filled 2015-01-02: qty 20

## 2015-01-02 MED ORDER — CEFAZOLIN SODIUM-DEXTROSE 2-3 GM-% IV SOLR
2.0000 g | INTRAVENOUS | Status: AC
  Administered 2015-01-02: 2 g via INTRAVENOUS

## 2015-01-02 MED ORDER — STERILE WATER FOR IRRIGATION IR SOLN
Status: DC | PRN
  Administered 2015-01-02: 1000 mL

## 2015-01-02 MED ORDER — LACTATED RINGERS IV SOLN
INTRAVENOUS | Status: AC
  Administered 2015-01-02: 15:00:00 via INTRAVENOUS
  Administered 2015-01-02: 1000 mL via INTRAVENOUS

## 2015-01-02 MED ORDER — SODIUM CHLORIDE 0.9 % IJ SOLN
INTRAMUSCULAR | Status: AC
  Filled 2015-01-02: qty 20

## 2015-01-02 MED ORDER — LACTATED RINGERS IR SOLN
Status: DC | PRN
  Administered 2015-01-02: 1000 mL

## 2015-01-02 MED ORDER — IOHEXOL 300 MG/ML  SOLN
INTRAMUSCULAR | Status: DC | PRN
  Administered 2015-01-02: 30 mL via URETHRAL

## 2015-01-02 MED ORDER — PROPOFOL 10 MG/ML IV BOLUS
INTRAVENOUS | Status: DC | PRN
  Administered 2015-01-02: 150 mg via INTRAVENOUS

## 2015-01-02 MED ORDER — SODIUM CHLORIDE 0.9 % IJ SOLN
INTRAMUSCULAR | Status: DC | PRN
  Administered 2015-01-02: 10 mL

## 2015-01-02 MED ORDER — EPHEDRINE SULFATE 50 MG/ML IJ SOLN
INTRAMUSCULAR | Status: DC | PRN
  Administered 2015-01-02: 15 mg via INTRAVENOUS

## 2015-01-02 MED ORDER — FENTANYL CITRATE (PF) 100 MCG/2ML IJ SOLN
INTRAMUSCULAR | Status: AC
  Filled 2015-01-02: qty 2

## 2015-01-02 MED ORDER — HYDROMORPHONE HCL 1 MG/ML IJ SOLN
INTRAMUSCULAR | Status: AC
  Filled 2015-01-02: qty 1

## 2015-01-02 SURGICAL SUPPLY — 73 items
BAG URO CATCHER STRL LF (MISCELLANEOUS) ×4 IMPLANT
BASKET LASER NITINOL 1.9FR (BASKET) ×4 IMPLANT
BASKET STNLS GEMINI 4WIRE 3FR (BASKET) IMPLANT
BASKET ZERO TIP NITINOL 2.4FR (BASKET) IMPLANT
CABLE HIGH FREQUENCY MONO STRZ (ELECTRODE) IMPLANT
CATH FOLEY 2WAY SLVR 18FR 30CC (CATHETERS) ×4 IMPLANT
CATH INTERMIT  6FR 70CM (CATHETERS) ×4 IMPLANT
CATH TIEMANN FOLEY 18FR 5CC (CATHETERS) IMPLANT
CHLORAPREP W/TINT 26ML (MISCELLANEOUS) ×4 IMPLANT
CLIP LIGATING HEM O LOK PURPLE (MISCELLANEOUS) IMPLANT
CLOTH BEACON ORANGE TIMEOUT ST (SAFETY) ×4 IMPLANT
CONT SPEC 4OZ CLIKSEAL STRL BL (MISCELLANEOUS) IMPLANT
COVER SURGICAL LIGHT HANDLE (MISCELLANEOUS) ×4 IMPLANT
COVER TIP SHEARS 8 DVNC (MISCELLANEOUS) ×2 IMPLANT
COVER TIP SHEARS 8MM DA VINCI (MISCELLANEOUS) ×2
CUTTER ECHEON FLEX ENDO 45 340 (ENDOMECHANICALS) IMPLANT
DECANTER SPIKE VIAL GLASS SM (MISCELLANEOUS) ×4 IMPLANT
DRAIN CHANNEL 19F RND (DRAIN) ×4 IMPLANT
DRSG TEGADERM 4X4.75 (GAUZE/BANDAGES/DRESSINGS) ×8 IMPLANT
DRSG TEGADERM 6X8 (GAUZE/BANDAGES/DRESSINGS) IMPLANT
ELECT REM PT RETURN 9FT ADLT (ELECTROSURGICAL) ×4
ELECTRODE REM PT RTRN 9FT ADLT (ELECTROSURGICAL) ×2 IMPLANT
FIBER LASER FLEXIVA 1000 (UROLOGICAL SUPPLIES) IMPLANT
FIBER LASER FLEXIVA 200 (UROLOGICAL SUPPLIES) ×4 IMPLANT
FIBER LASER FLEXIVA 365 (UROLOGICAL SUPPLIES) IMPLANT
FIBER LASER FLEXIVA 550 (UROLOGICAL SUPPLIES) IMPLANT
FIBER LASER TRAC TIP (UROLOGICAL SUPPLIES) ×4 IMPLANT
GAUZE SPONGE 2X2 8PLY STRL LF (GAUZE/BANDAGES/DRESSINGS) ×2 IMPLANT
GLOVE BIO SURGEON STRL SZ 6.5 (GLOVE) ×3 IMPLANT
GLOVE BIO SURGEONS STRL SZ 6.5 (GLOVE) ×1
GLOVE BIOGEL M STRL SZ7.5 (GLOVE) ×8 IMPLANT
GLOVE BIOGEL PI IND STRL 7.5 (GLOVE) ×2 IMPLANT
GLOVE BIOGEL PI INDICATOR 7.5 (GLOVE) ×2
GOWN STRL REUS W/TWL LRG LVL3 (GOWN DISPOSABLE) ×8 IMPLANT
GOWN STRL REUS W/TWL LRG LVL4 (GOWN DISPOSABLE) ×12 IMPLANT
GOWN STRL REUS W/TWL XL LVL3 (GOWN DISPOSABLE) ×8 IMPLANT
GUIDEWIRE ANG ZIPWIRE 038X150 (WIRE) ×4 IMPLANT
GUIDEWIRE STR DUAL SENSOR (WIRE) ×4 IMPLANT
HOLDER FOLEY CATH W/STRAP (MISCELLANEOUS) IMPLANT
IV LACTATED RINGERS 1000ML (IV SOLUTION) IMPLANT
IV NS IRRIG 3000ML ARTHROMATIC (IV SOLUTION) IMPLANT
KIT PROCEDURE DA VINCI SI (MISCELLANEOUS) ×2
KIT PROCEDURE DVNC SI (MISCELLANEOUS) ×2 IMPLANT
LIQUID BAND (GAUZE/BANDAGES/DRESSINGS) ×4 IMPLANT
NEEDLE INSUFFLATION 14GA 120MM (NEEDLE) ×4 IMPLANT
NEEDLE SPNL 22GX7 QUINCKE BK (NEEDLE) IMPLANT
PACK CYSTO (CUSTOM PROCEDURE TRAY) ×4 IMPLANT
PACK ROBOT UROLOGY CUSTOM (CUSTOM PROCEDURE TRAY) ×4 IMPLANT
PAD POSITIONING PINK XL (MISCELLANEOUS) ×4 IMPLANT
RELOAD GREEN ECHELON 45 (STAPLE) IMPLANT
SET TUBE IRRIG SUCTION NO TIP (IRRIGATION / IRRIGATOR) ×4 IMPLANT
SHEATH ACCESS URETERAL 38CM (SHEATH) ×4 IMPLANT
SHEET LAVH (DRAPES) IMPLANT
SOLUTION ELECTROLUBE (MISCELLANEOUS) ×4 IMPLANT
SPONGE GAUZE 2X2 STER 10/PKG (GAUZE/BANDAGES/DRESSINGS) ×2
SPONGE LAP 4X18 X RAY DECT (DISPOSABLE) ×4 IMPLANT
STENT PERCUFLEX 4.8FRX26 (STENTS) ×4 IMPLANT
SUT ETHILON 3 0 PS 1 (SUTURE) ×4 IMPLANT
SUT MNCRL AB 4-0 PS2 18 (SUTURE) ×4 IMPLANT
SUT PDS AB 1 CT1 27 (SUTURE) ×8 IMPLANT
SUT VIC AB 2-0 SH 27 (SUTURE) ×2
SUT VIC AB 2-0 SH 27X BRD (SUTURE) ×2 IMPLANT
SUT VICRYL 0 UR6 27IN ABS (SUTURE) ×4 IMPLANT
SUT VLOC BARB 180 ABS3/0GR12 (SUTURE) ×4
SUTURE VLOC BRB 180 ABS3/0GR12 (SUTURE) ×2 IMPLANT
SYR 27GX1/2 1ML LL SAFETY (SYRINGE) IMPLANT
SYRINGE 10CC LL (SYRINGE) ×4 IMPLANT
SYRINGE IRR TOOMEY STRL 70CC (SYRINGE) IMPLANT
TOWEL OR 17X26 10 PK STRL BLUE (TOWEL DISPOSABLE) ×8 IMPLANT
TOWEL OR NON WOVEN STRL DISP B (DISPOSABLE) ×4 IMPLANT
TROCAR 12M 150ML BLUNT (TROCAR) ×4 IMPLANT
TUBE FEEDING 8FR 16IN STR KANG (MISCELLANEOUS) ×4 IMPLANT
WATER STERILE IRR 1500ML POUR (IV SOLUTION) ×8 IMPLANT

## 2015-01-02 NOTE — Brief Op Note (Signed)
01/02/2015  4:28 PM  PATIENT:  Walter Koch  54 y.o. male  PRE-OPERATIVE DIAGNOSIS:  RIGHT URETERAL STONE, LARGE VOLUME BLADDER STONES  POST-OPERATIVE DIAGNOSIS:  RIGHT URETERAL STONE, LARGE VOLUME BLADDER   PROCEDURE:  Procedure(s): ROBOTIC ASSISTED LAPAROSCOPIC CYSTOLITHALOPAXY    (N/A) CYSTOSCOPY WITH RIGHT RETROGRADE PYELOGRAM, URETEROSCOPY AND STENT EXCHANGE (Right) HOLMIUM LASER APPLICATION (Right)  SURGEON:  Surgeon(s) and Role:    * Alexis Frock, MD - Primary  PHYSICIAN ASSISTANT:   ASSISTANTS: Debbrah Alar PA   ANESTHESIA:   general  EBL:  Total I/O In: 1000 [I.V.:1000] Out: -   BLOOD ADMINISTERED:none  DRAINS: JP to bulb, Foley to gravity   LOCAL MEDICATIONS USED:  MARCAINE     SPECIMEN:  Source of Specimen:  1- rt ureteral stone fragments, 2 - bladder stones  DISPOSITION OF SPECIMEN:  ureteral stones - [facility] for compositional analysis; Bladder Stones - given to pt's family  COUNTS:  YES  TOURNIQUET:  * No tourniquets in log *  DICTATION: .Other Dictation: Dictation Number [id number]  PLAN OF CARE: Admit to inpatient   PATIENT DISPOSITION:  PACU - hemodynamically stable.   Delay start of Pharmacological VTE agent (>24hrs) due to surgical blood loss or risk of bleeding: yes

## 2015-01-02 NOTE — Discharge Instructions (Signed)
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
## 2015-01-02 NOTE — Anesthesia Procedure Notes (Signed)
Procedure Name: Intubation Date/Time: 01/02/2015 1:52 PM Performed by: Lajuana Carry E Pre-anesthesia Checklist: Patient identified, Emergency Drugs available, Suction available and Patient being monitored Patient Re-evaluated:Patient Re-evaluated prior to inductionOxygen Delivery Method: Circle System Utilized Preoxygenation: Pre-oxygenation with 100% oxygen Intubation Type: IV induction Ventilation: Mask ventilation without difficulty Laryngoscope Size: Miller and 2 Grade View: Grade I Tube type: Oral Tube size: 7.5 mm Number of attempts: 1 Airway Equipment and Method: Stylet Placement Confirmation: ETT inserted through vocal cords under direct vision,  positive ETCO2 and breath sounds checked- equal and bilateral Secured at: 21 cm Tube secured with: Tape Dental Injury: Teeth and Oropharynx as per pre-operative assessment

## 2015-01-02 NOTE — Anesthesia Preprocedure Evaluation (Addendum)
Anesthesia Evaluation  Patient identified by MRN, date of birth, ID band Patient awake    Reviewed: Allergy & Precautions, H&P , NPO status , Patient's Chart, lab work & pertinent test results  Airway Mallampati: II  TM Distance: >3 FB Neck ROM: full    Dental  (+) Dental Advisory Given, Missing Left upper front missing:   Pulmonary neg pulmonary ROS,    Pulmonary exam normal breath sounds clear to auscultation       Cardiovascular Exercise Tolerance: Good negative cardio ROS Normal cardiovascular exam Rhythm:regular Rate:Normal     Neuro/Psych negative neurological ROS  negative psych ROS   GI/Hepatic negative GI ROS, Neg liver ROS,   Endo/Other  negative endocrine ROS  Renal/GU negative Renal ROS  negative genitourinary   Musculoskeletal   Abdominal   Peds  Hematology negative hematology ROS (+)   Anesthesia Other Findings   Reproductive/Obstetrics negative OB ROS                            Anesthesia Physical Anesthesia Plan  ASA: I  Anesthesia Plan: General   Post-op Pain Management:    Induction: Intravenous  Airway Management Planned: Oral ETT  Additional Equipment:   Intra-op Plan:   Post-operative Plan: Extubation in OR  Informed Consent: I have reviewed the patients History and Physical, chart, labs and discussed the procedure including the risks, benefits and alternatives for the proposed anesthesia with the patient or authorized representative who has indicated his/her understanding and acceptance.   Dental Advisory Given  Plan Discussed with: CRNA and Surgeon  Anesthesia Plan Comments:         Anesthesia Quick Evaluation

## 2015-01-02 NOTE — Transfer of Care (Signed)
Immediate Anesthesia Transfer of Care Note  Patient: Walter Koch  Procedure(s) Performed: Procedure(s): ROBOTIC ASSISTED LAPAROSCOPIC CYSTOLITHALOPAXY    (N/A) CYSTOSCOPY WITH RIGHT RETROGRADE PYELOGRAM, URETEROSCOPY AND STENT EXCHANGE (Right) HOLMIUM LASER APPLICATION (Right)  Patient Location: PACU  Anesthesia Type:General  Level of Consciousness: awake, alert  and oriented  Airway & Oxygen Therapy: Patient Spontanous Breathing and Patient connected to face mask oxygen  Post-op Assessment: Report given to RN and Post -op Vital signs reviewed and stable  Post vital signs: Reviewed and stable  Last Vitals:  Filed Vitals:   01/02/15 0900  BP: 138/99  Pulse: 97  Temp: 36.2 C  Resp: 18    Complications: No apparent anesthesia complications

## 2015-01-02 NOTE — Anesthesia Postprocedure Evaluation (Signed)
Anesthesia Post Note  Patient: OSAMU MAJEWSKI  Procedure(s) Performed: Procedure(s) (LRB): ROBOTIC ASSISTED LAPAROSCOPIC CYSTOLITHALOPAXY    (N/A) CYSTOSCOPY WITH RIGHT RETROGRADE PYELOGRAM, URETEROSCOPY AND STENT EXCHANGE (Right) HOLMIUM LASER APPLICATION (Right)  Patient location during evaluation: PACU Anesthesia Type: General Level of consciousness: awake and alert Pain management: pain level controlled Vital Signs Assessment: post-procedure vital signs reviewed and stable Respiratory status: spontaneous breathing, nonlabored ventilation, respiratory function stable and patient connected to nasal cannula oxygen Cardiovascular status: blood pressure returned to baseline and stable Postop Assessment: no signs of nausea or vomiting Anesthetic complications: no    Last Vitals:  Filed Vitals:   01/02/15 1652 01/02/15 1700  BP: 138/90 150/86  Pulse: 104 95  Temp: 36.7 C   Resp: 15 17    Last Pain: There were no vitals filed for this visit.               Parker Sawatzky L

## 2015-01-03 ENCOUNTER — Encounter (HOSPITAL_COMMUNITY): Payer: Self-pay | Admitting: Urology

## 2015-01-03 LAB — BASIC METABOLIC PANEL
ANION GAP: 7 (ref 5–15)
BUN: 12 mg/dL (ref 6–20)
CALCIUM: 8.8 mg/dL — AB (ref 8.9–10.3)
CHLORIDE: 110 mmol/L (ref 101–111)
CO2: 23 mmol/L (ref 22–32)
Creatinine, Ser: 1.09 mg/dL (ref 0.61–1.24)
GFR calc non Af Amer: >60 mL/min (ref >60)
Glucose, Bld: 157 mg/dL — ABNORMAL HIGH (ref 65–99)
Potassium: 4.6 mmol/L (ref 3.5–5.1)
Sodium: 140 mmol/L (ref 135–145)

## 2015-01-03 LAB — HEMOGLOBIN AND HEMATOCRIT, BLOOD
HEMATOCRIT: 42.8 % (ref 39.0–52.0)
HEMOGLOBIN: 14.1 g/dL (ref 13.0–17.0)

## 2015-01-03 MED ORDER — SENNA 8.6 MG PO TABS: 1.0000 | ORAL_TABLET | Freq: Two times a day (BID) | ORAL | Status: DC

## 2015-01-03 NOTE — Op Note (Signed)
NAMEMarland Kitchen  Walter Koch, Walter Koch NO.:  [id number]  MEDICAL RECORD NO.:  [id number]  LOCATION:  [id number]                         FACILITY:  [facility]  PHYSICIAN:  Alexis Frock, MD     DATE OF BIRTH:  May 22, 1960  DATE OF PROCEDURE: 01/02/2015                              OPERATIVE REPORT  DIAGNOSES:  Right ureteral stone, large-volume bladder stones, history of pelvic trauma.  PROCEDURES: 1. Cystoscopy with right retrograde pyelogram and interpretation. 2. Right ureteroscopy with laser lithotripsy, second stage. 3. Exchange of right ureteral stent, 6 x 26, Contour, no tether. 4. Robotic-assisted laparoscopic cystolitholapaxy.  ASSISTANT:  Debbrah Alar, PA.  ESTIMATED BLOOD LOSS:  Nil.  COMPLICATIONS:  None.  SPECIMENS: 1. Right ureteral stone fragments for compositional analysis. 2. Large volume bladder stones given to the patient.  DRAINS: 1. Foley catheter to straight drain. 2. Jackson-Pratt drain to bulb suction.  FINDINGS: 1. Large right midureteral stone. 2. Complete resolution of all right renal ureteral stones following     holmium laser lithotripsy and basket extraction. 3. Successful exchange of right ureteral stent, proximal in the renal     pelvis and distal in the urinary bladder. 4. Large volume bladder stones 3 x 4 cm each, completely removed.  INDICATION:  Walter Koch is a very pleasant 54 year old gentleman with remote history of polytrauma.  He had a prolonged hospitalization around 2009 as well as prolonged catheterizations per urethra and multiple catheters.  He presented with acute colicky right flank pain several months ago and was noted to have multifocal right ureteral stone as well as a mass with volume of bladder stones.  Amazingly, he had no significant urinary tract infection such as enlarged prostate or significant urethral strictures.  We then endorsed the aforementioned history of prolonged catheterization raises suspicion of possible  stone, nidus, sent as original trauma.  Options were discussed for management including open approaches, endoscopic approaches and laparoscopic approaches and he underwent right first-stage ureteroscopic stone manipulation at his original presentation several months ago.  However, due to some relative narrowing and fixation of his ureter very close to the area of his pelvis and prior pelvic hardware.  The more proximal larger stone was unable to be addressed; therefore, he underwent stenting at that time to allow for passive dilation.  He now presents for second-stage right ureteroscopic sone manipulation.  In terms of his bladder stones, we discussed open litholapaxy versus endoscopic procedure versus laparoscopic approach.  He wished to proceed with the latter as this was felt to be many times more efficient given the very large volume of stones.  Informed consent was obtained and placed in the medical record.  PROCEDURE IN DETAIL:  The patient being Walter Koch, was verified. Procedure using right second-stage ureteroscopic stone manipulation and robotic cystolitholapaxy was confirmed.  Procedure was carried out. Time-out was performed.  Intravenous antibiotics were administered. General endotracheal anesthesia was introduced.  The patient was placed into a low lithotomy position.  He was further fashioned on the operative table using 3-inch tape over foam padding across his supraxiphoid chest.  His arms were tucked at his side.  Test of steep Trendelenburg positioning was performed and was found to  be suitably positioned.  Sterile field was created by first clipper shaving his infra-xiphoid abdomen to the level of his pubis and prepping and draping his penis, perineum and proximal thighs using iodine x3.  Next, cystourethroscopy was performed using a 23-French rigid cystoscope with 30-degree offset lens.  Inspection of the urinary bladder revealed massive volume bladder stones as  anticipated and distal end of right ureteral stent was seen in situ.  Distal end of the stent was grasped, brought to the level of the urethral meatus.  Through which, a 0.038 Zip wire was advanced at the level of the upper pole and set aside as a safety wire.  This stent was exchanged for an open-ended catheter and right retrograde pyelogram was obtained.  Right retrograde pyelogram demonstrated a single right ureter with single-system right kidney.  There were some filling defect in the midureter consistent with known stone.  A 0.038 Zip wire was once again advanced to the upper pole and set aside as a safety wire.  Semi-rigid ureteroscopy was performed in the distal half of the right ureter, alongside a separate Sensor working wire.  No mucosal abnormalities were found.  The semi-rigid uteroscope was then exchanged for 12/14, 36-cm ureteral access sheath at the level of the midureter, not extending above the area of prior ureteroscopic visualization and flexible ureteroscopy was performed using a dual-channel flexible digital ureteroscope.  At the level of the proximal midureter, a dominant calcification was encountered.  This resulted much large for simple basketing.  As such, holmium laser energy was applied to the stone using settings of 0.5 joules and 5 hertz fragmenting the stone approximately four smaller pieces.  These were then sequentially grasped with an escape basket on their long axis, removed and set aside for compositional analysis.  Repeat panendoscopic examination of the right ureter as well as systematic inspection of all right calices x2 of his kidney revealed complete resolution of all stone fragments larger than 1/3 mm and no evidence of mucosal violation.  Given the access sheath, it was felt that interval stenting would be warranted.  As such, the sheath was removed under continuous uteroscopic vision, no mucosal abnormalities were found and a new 6 x 26  Contour-type stent was placed using cystoscopic and fluoroscopic guidance, proximal in the renal pelvis cyst and distal in the urinary bladder, and the cystoscopic portion of the procedure was then terminated.  The patient was completely re-prepped and draped, at this time prepping his infra- xiphoid abdomen using chlorhexidine gluconate and his penis, perineum and proximal thighs once again using iodine.  A new Foley catheter was placed per urethra to straight drain and a high-flow, low-pressure pneumoperitoneum was obtained using Veress technique in the supraumbilical midline having passed the aspiration and drop test.  An 8- mm robotic camera port was placed into this location.  Laparoscopic examination of the peritoneal cavity revealed no significant adhesions and no visceral injury.  Additional ports were placed as follows:  Right paramedian 8-mm robotic port, right far lateral 12-mm assistant port, left paramedian 8-mm robotic port, left far lateral 8-mm robotic port, and right paramedian 5-mm suction port.  Robot was docked and passed through the electronic checks.  Bladder was filled approximately 300 mL, allowing visualization of the dome of the bladder and a cystotomy was made approximately 6 cm in length into the dome.  Inspection of the urinary bladder revealed large volume bladder stone as mentioned cystoscopically, these were then each grasped and placed into an EndoCatch bag,  taking great care to avoid contact of the stents with the peritoneal cavity contents directly, which did not occur.  Via the dome incision, the catheter and distal end of the right stent were seen and the ureters and these hardware were well away from the area of the cystotomy.  The cystotomy was then closed using first layer running 3-0 V-Loc approximating the mucosa and seromuscular layer followed by a second imbricating layer of 2-0 V-Loc, which reapproximated the peritoneum imbricating it over the  first suture line.  The bladder was filled to 100 mL and was found to be watertight and reconnected to straight drain.  Robot was then undocked.  A closed suction drain was brought through the previous left lateral most robotic port site to the area of the peritoneal cavity.  The previous right 12-mm assistant port site was closed at the level of the fascia using Carter-Thomason suture passer and 0 Vicryl.  Specimen was retrieved by extending the previous camera port site inferiorly for total distance approximately 3.5 cm removing all the bladder stone setting aside for discard.  The extraction site was closed at the level of fascia using figure-of-eight PDS x3 followed by reapproximation of the Scarpa's with ring Vicryl. All incision sites were infiltrated with dilute lyophilized Marcaine and closed at the level of the skin using subcuticular Monocryl followed by Dermabond.  Drain stitch was applied.  The bladder was once again irrigated and found to be completely clear.  No blood clots reconnected to straight drain and the procedure was terminated.  The patient tolerated the procedure well.  There were no immediate periprocedural complications.  The patient was taken to the postanesthesia care unit in stable condition.          ______________________________ Alexis Frock, MD     TM/MEDQ  D:  01/02/2015  T:  01/03/2015  Job:  [id number]

## 2015-01-03 NOTE — Progress Notes (Signed)
Went over all discharge information with patient.  Explained and demonstrated foley care and leg bag teaching. Gave patient needed supplies.  All questions answered.  Patient did teach back with foley care.  Discharge summary and prescriptions given.  Explained importance of follow up appointment and taking medications as prescribed.  Pt will be wheeled out when ride arrives.

## 2015-01-03 NOTE — Discharge Summary (Signed)
Physician Discharge Summary  Patient ID: Walter Koch MRN: [id number] DOB/AGE: 1960-11-13 54 y.o.  Admit date: 01/02/2015 Discharge date: 01/03/2015  Admission Diagnoses: Rt Ureteral Stone, Bladder Stones  Discharge Diagnoses:  Active Problems:   Bladder stones   Discharged Condition: good  Hospital Course:   Rt Ureteral Stone, Bladder Stones - Pt underwent right ureteroscopic stone manipulation with stent exchange and robotic cystolithalopexy on 12/14, the day of admission, without acute complications. Observed on the 4th floor Urology service where he began his recover. By the afternoon of POD 1 he is ambulatory, tollerating PO diet, pain controlled with PO meds, and felt to be adequate for discharge. JP drain removed prior to discharge as output scant.   Consults: None  Significant Diagnostic Studies: labs: Hgb >10  Treatments: surgery:  right ureteroscopic stone manipulation with stent exchange and robotic cystolithalopexy on 12/14  Discharge Exam: Blood pressure 124/79, pulse 71, temperature 97.4 F (36.3 C), temperature source Oral, resp. rate 18, height 5\' 10"  (1.778 m), weight 109.317 kg (241 lb), SpO2 95 %. General appearance: alert, cooperative and appears stated age Head: Normocephalic, without obvious abnormality, atraumatic Eyes: negative Nose: Nares normal. Septum midline. Mucosa normal. No drainage or sinus tenderness. Throat: lips, mucosa, and tongue normal; teeth and gums normal Neck: supple, symmetrical, trachea midline Back: symmetric, no curvature. ROM normal. No CVA tenderness. Resp: non-labored on room air Cardio: Nl rate GI: soft, non-tender; bowel sounds normal; no masses,  no organomegaly Male genitalia: normal, foley c/d/i with clear yellow urine.  Extremities: extremities normal, atraumatic, no cyanosis or edema Lymph nodes: Cervical, supraclavicular, and axillary nodes normal. Neurologic: Grossly normal Incision/Wound: recent port sties /  extraction sites c/d/i.   Disposition: 01-Home or Self Care     Medication List    TAKE these medications        oxyCODONE-acetaminophen 5-325 MG tablet  Commonly known as:  PERCOCET/ROXICET  Take 1-2 tablets by mouth every 6 (six) hours as needed for severe pain.     senna 8.6 MG tablet  Commonly known as:  SENOKOT  Take 1 tablet by mouth daily as needed for constipation.     senna 8.6 MG Tabs tablet  Commonly known as:  SENOKOT  Take 1 tablet (8.6 mg total) by mouth 2 (two) times daily. While taking pain meds to prevent constipation.     sulfamethoxazole-trimethoprim 800-160 MG tablet  Commonly known as:  BACTRIM DS,SEPTRA DS  Take 1 tablet by mouth 2 (two) times daily. Start the day prior to foley removal appointment     tamsulosin 0.4 MG Caps capsule  Commonly known as:  FLOMAX  Take 1 capsule (0.4 mg total) by mouth daily after supper.           Follow-up Information    Follow up with Alexis Frock, MD On 01/10/2015.   Specialty:  Urology   Why:  at 11:15 for X-Ray, MD visit, and catheter removal   Contact information:   [address] [place] [address] [phone]       Signed: Alexis Frock 01/03/2015, 5:25 PM

## 2015-01-03 NOTE — Care Management Obs Status (Signed)
MEDICARE OBSERVATION STATUS NOTIFICATION   Patient Details  Name: ZAREN DUSCH MRN: [id number] Date of Birth: 1960-12-06   Medicare Observation Status Notification Given:  Yes    Guadalupe Maple, RN 01/03/2015, 3:30 PM

## 2015-01-03 NOTE — Progress Notes (Signed)
Patient was wheeled out of the hospital for discharge, in a wheelchair, with spouse at his side. Patient was happy to be going home.

## 2015-01-04 DIAGNOSIS — N200 Calculus of kidney: Secondary | ICD-10-CM | POA: Diagnosis not present

## 2015-01-10 DIAGNOSIS — N210 Calculus in bladder: Secondary | ICD-10-CM | POA: Diagnosis not present

## 2015-01-24 DIAGNOSIS — Z0000 Encounter for general adult medical examination without abnormal findings: Secondary | ICD-10-CM | POA: Diagnosis not present

## 2015-01-24 DIAGNOSIS — N210 Calculus in bladder: Secondary | ICD-10-CM | POA: Diagnosis not present

## 2015-01-24 DIAGNOSIS — N200 Calculus of kidney: Secondary | ICD-10-CM | POA: Diagnosis not present

## 2016-03-02 ENCOUNTER — Ambulatory Visit (INDEPENDENT_AMBULATORY_CARE_PROVIDER_SITE_OTHER): Payer: Medicare Other | Admitting: Internal Medicine

## 2016-03-02 ENCOUNTER — Encounter: Payer: Self-pay | Admitting: Internal Medicine

## 2016-03-02 ENCOUNTER — Other Ambulatory Visit: Payer: Self-pay | Admitting: Internal Medicine

## 2016-03-02 VITALS — BP 122/76 | HR 68 | Temp 97.8°F | Ht 69.25 in | Wt 236.0 lb

## 2016-03-02 DIAGNOSIS — B351 Tinea unguium: Secondary | ICD-10-CM

## 2016-03-02 LAB — COMPREHENSIVE METABOLIC PANEL
ALT: 23 U/L (ref 0–53)
AST: 25 U/L (ref 0–37)
Albumin: 4.2 g/dL (ref 3.5–5.2)
Alkaline Phosphatase: 69 U/L (ref 39–117)
BUN: 15 mg/dL (ref 6–23)
CHLORIDE: 107 meq/L (ref 96–112)
CO2: 26 meq/L (ref 19–32)
CREATININE: 0.95 mg/dL (ref 0.40–1.50)
Calcium: 9 mg/dL (ref 8.4–10.5)
GFR: 86.75 mL/min (ref >60.00)
GLUCOSE: 95 mg/dL (ref 70–99)
Potassium: 4 mEq/L (ref 3.5–5.1)
SODIUM: 139 meq/L (ref 135–145)
Total Bilirubin: 0.6 mg/dL (ref 0.2–1.2)
Total Protein: 7.2 g/dL (ref 6.0–8.3)

## 2016-03-02 MED ORDER — TERBINAFINE HCL 250 MG PO TABS: 250.0000 mg | ORAL_TABLET | Freq: Every day | ORAL | 0 refills | Status: DC

## 2016-03-02 NOTE — Patient Instructions (Signed)

## 2016-03-02 NOTE — Progress Notes (Signed)
HPI  Pt presents to the clinic today to establish care and for management of the conditions listed below. He has not had a PCP in many years.    History of Kidney/Bladder Stones: s/p 2 surgeries for removal of stones and stent placement. He is following with [facility].  He also c/o bilateral toenail fungus. He has had this for a few years. He thinks he is getting from a jacuzzi tub. He has not tried anything OTC for this.   Flu: never Tetanus: 2017, [facility] PSA Screening: unsure Colon Screening: never Vision Screening: as needed Dentist: biannually  Past Medical History:  Diagnosis Date  . History of transfusion   . Kidney stone     No current outpatient prescriptions on file.   No current facility-administered medications for this visit.     No Known Allergies  Family History  Problem Relation Age of Onset  . Colon cancer Mother 1  . Colon cancer Sister 80    Social History   Social History  . Marital status: Married    Spouse name: N/A  . Number of children: N/A  . Years of education: N/A   Occupational History  . Not on file.   Social History Main Topics  . Smoking status: Never Smoker  . Smokeless tobacco: Never Used  . Alcohol use No  . Drug use: No  . Sexual activity: Yes   Other Topics Concern  . Not on file   Social History Narrative  . No narrative on file    ROS:  Constitutional: Denies fever, malaise, fatigue, headache or abrupt weight changes.  HEENT: Denies eye pain, eye redness, ear pain, ringing in the ears, wax buildup, runny nose, nasal congestion, bloody nose, or sore throat. Respiratory: Denies difficulty breathing, shortness of breath, cough or sputum production.   Cardiovascular: Denies chest pain, chest tightness, palpitations or swelling in the hands or feet.  Gastrointestinal: Denies abdominal pain, bloating, constipation, diarrhea or blood in the stool.  GU: Denies frequency, urgency, pain with urination,  blood in urine, odor or discharge. Musculoskeletal: Denies decrease in range of motion, difficulty with gait, muscle pain or joint pain and swelling.  Skin: Pt reports toenail fungus. Denies redness, rashes, lesions or ulcercations.  Neurological: Denies dizziness, difficulty with memory, difficulty with speech or problems with balance and coordination.  Psych: Denies anxiety, depression, SI/HI.  No other specific complaints in a complete review of systems (except as listed in HPI above).  PE:  BP 122/76   Pulse 68   Temp 97.8 F (36.6 C) (Oral)   Ht 5' 9.25" (1.759 m)   Wt 236 lb (107 kg)   SpO2 97%   BMI 34.60 kg/m   Wt Readings from Last 3 Encounters:  03/02/16 236 lb (107 kg)  01/02/15 241 lb (109.3 kg)  12/31/14 241 lb (109.3 kg)    General: Appears his stated age, well developed, well nourished in NAD. Skin: Thick, discolored mycotic toenails bilaterally. Cardiovascular: Normal rate and rhythm.  Pulmonary/Chest: Normal effort and positive vesicular breath sounds. No respiratory distress. No wheezes, rales or ronchi noted.  Neurological: Alert and oriented.  Psychiatric: Mood and affect normal. Behavior is normal. Judgment and thought content normal.     BMET    Component Value Date/Time   NA 140 01/03/2015 0529   K 4.6 01/03/2015 0529   CL 110 01/03/2015 0529   CO2 23 01/03/2015 0529   GLUCOSE 157 (H) 01/03/2015 0529   BUN 12 01/03/2015 0529  CREATININE 1.09 01/03/2015 0529   CALCIUM 8.8 (L) 01/03/2015 0529   GFRNONAA >60 01/03/2015 0529   GFRAA >60 01/03/2015 0529    Lipid Panel  No results found for: CHOL, TRIG, HDL, CHOLHDL, VLDL, LDLCALC  CBC    Component Value Date/Time   WBC 6.1 12/31/2014 1025   RBC 4.89 12/31/2014 1025   HGB 14.1 01/03/2015 0529   HCT 42.8 01/03/2015 0529   PLT 265 12/31/2014 1025   MCV 89.0 12/31/2014 1025   MCH 29.4 12/31/2014 1025   MCHC 33.1 12/31/2014 1025   RDW 13.3 12/31/2014 1025   LYMPHSABS 1.4 11/10/2014 0620    MONOABS 0.8 11/10/2014 0620   EOSABS 0.1 11/10/2014 0620   BASOSABS 0.0 11/10/2014 0620    Hgb A1C No results found for: HGBA1C   Assessment and Plan:  Mycotic Toenails:  CMET today If LFT's normal- will send in Terbinafine x 4 weeks, then repeat CMET at that time  History of bladder/kidney stones:  He will continue to follow with urology  RTC in 1 month for Medicare Wellness Exam Webb Silversmith, NP

## 2016-03-29 ENCOUNTER — Other Ambulatory Visit: Payer: Self-pay | Admitting: Internal Medicine

## 2016-04-02 ENCOUNTER — Telehealth: Payer: Self-pay

## 2016-04-02 NOTE — Telephone Encounter (Signed)
Pt request refill of med but pt cannot remember name of med and is riding down the road and pt will cb with name of med at later time.

## 2016-04-09 ENCOUNTER — Ambulatory Visit: Payer: Medicare Other | Admitting: Internal Medicine

## 2016-04-13 ENCOUNTER — Encounter (INDEPENDENT_AMBULATORY_CARE_PROVIDER_SITE_OTHER): Payer: Self-pay

## 2016-04-13 ENCOUNTER — Encounter: Payer: Self-pay | Admitting: Internal Medicine

## 2016-04-13 ENCOUNTER — Other Ambulatory Visit: Payer: Self-pay | Admitting: Internal Medicine

## 2016-04-13 ENCOUNTER — Ambulatory Visit (INDEPENDENT_AMBULATORY_CARE_PROVIDER_SITE_OTHER): Payer: Medicare Other | Admitting: Internal Medicine

## 2016-04-13 VITALS — BP 124/82 | HR 67 | Temp 98.0°F | Ht 69.25 in | Wt 240.5 lb

## 2016-04-13 DIAGNOSIS — Z1159 Encounter for screening for other viral diseases: Secondary | ICD-10-CM

## 2016-04-13 DIAGNOSIS — Z114 Encounter for screening for human immunodeficiency virus [HIV]: Secondary | ICD-10-CM | POA: Diagnosis not present

## 2016-04-13 DIAGNOSIS — Z0000 Encounter for general adult medical examination without abnormal findings: Secondary | ICD-10-CM

## 2016-04-13 DIAGNOSIS — Z1211 Encounter for screening for malignant neoplasm of colon: Secondary | ICD-10-CM | POA: Diagnosis not present

## 2016-04-13 DIAGNOSIS — Z5181 Encounter for therapeutic drug level monitoring: Secondary | ICD-10-CM

## 2016-04-13 DIAGNOSIS — Z136 Encounter for screening for cardiovascular disorders: Secondary | ICD-10-CM

## 2016-04-13 DIAGNOSIS — Z125 Encounter for screening for malignant neoplasm of prostate: Secondary | ICD-10-CM | POA: Diagnosis not present

## 2016-04-13 LAB — COMPREHENSIVE METABOLIC PANEL
ALK PHOS: 70 U/L (ref 39–117)
ALT: 23 U/L (ref 0–53)
AST: 24 U/L (ref 0–37)
Albumin: 4.6 g/dL (ref 3.5–5.2)
BILIRUBIN TOTAL: 0.5 mg/dL (ref 0.2–1.2)
BUN: 16 mg/dL (ref 6–23)
CO2: 23 meq/L (ref 19–32)
CREATININE: 1.01 mg/dL (ref 0.40–1.50)
Calcium: 9.6 mg/dL (ref 8.4–10.5)
Chloride: 105 mEq/L (ref 96–112)
GFR: 80.79 mL/min (ref >60.00)
GLUCOSE: 89 mg/dL (ref 70–99)
Potassium: 4.2 mEq/L (ref 3.5–5.1)
Sodium: 141 mEq/L (ref 135–145)
TOTAL PROTEIN: 7.8 g/dL (ref 6.0–8.3)

## 2016-04-13 LAB — CBC
HCT: 45.6 % (ref 39.0–52.0)
Hemoglobin: 15.6 g/dL (ref 13.0–17.0)
MCHC: 34.3 g/dL (ref 30.0–36.0)
MCV: 88.2 fl (ref 78.0–100.0)
Platelets: 295 10*3/uL (ref 150.0–400.0)
RBC: 5.16 Mil/uL (ref 4.22–5.81)
RDW: 13.5 % (ref 11.5–15.5)
WBC: 6.8 10*3/uL (ref 4.0–10.5)

## 2016-04-13 LAB — LDL CHOLESTEROL, DIRECT: LDL DIRECT: 76 mg/dL

## 2016-04-13 LAB — LIPID PANEL
CHOL/HDL RATIO: 8
Cholesterol: 229 mg/dL — ABNORMAL HIGH (ref 0–200)
HDL: 30.1 mg/dL — AB (ref >39.00)
NonHDL: 198.43
Triglycerides: 288 mg/dL — ABNORMAL HIGH (ref 0.0–149.0)
VLDL: 57.6 mg/dL — AB (ref 0.0–40.0)

## 2016-04-13 LAB — PSA, MEDICARE: PSA: 2 ng/ml (ref 0.10–4.00)

## 2016-04-13 NOTE — Progress Notes (Signed)
HPI:  Pt presents to the clinic today for his Medicare Wellness Exam.  Past Medical History:  Diagnosis Date  . History of transfusion   . Kidney stone     Current Outpatient Prescriptions  Medication Sig Dispense Refill  . terbinafine (LAMISIL) 250 MG tablet Take 1 tablet (250 mg total) by mouth daily. 30 tablet 0   No current facility-administered medications for this visit.     No Known Allergies  Family History  Problem Relation Age of Onset  . Colon cancer Mother 58  . Colon cancer Sister 85    Social History   Social History  . Marital status: Married    Spouse name: N/A  . Number of children: N/A  . Years of education: N/A   Occupational History  . Not on file.   Social History Main Topics  . Smoking status: Never Smoker  . Smokeless tobacco: Never Used  . Alcohol use No  . Drug use: No  . Sexual activity: Yes   Other Topics Concern  . Not on file   Social History Narrative  . No narrative on file    Hospitiliaztions: None  Health Maintenance:    Flu: never  Tetanus: 02/2015  PSA: 10/2015  Colon Screening: never  Eye Doctor: as needed  Dental Exam: biannually   Providers:   PCP: Webb Silversmith, NP-C   I have personally reviewed and have noted:  1. The patient's medical and social history 2. Their use of alcohol, tobacco or illicit drugs 3. Their current medications and supplements 4. The patient's functional ability including ADL's, fall risks, home safety risks and hearing or visual impairment. 5. Diet and physical activities 6. Evidence for depression or mood disorder  Subjective:   Review of Systems:   Constitutional: Denies fever, malaise, fatigue, headache or abrupt weight changes.  HEENT: Denies eye pain, eye redness, ear pain, ringing in the ears, wax buildup, runny nose, nasal congestion, bloody nose, or sore throat. Respiratory: Denies difficulty breathing, shortness of breath, cough or sputum production.   Cardiovascular:  Denies chest pain, chest tightness, palpitations or swelling in the hands or feet.  Gastrointestinal: Denies abdominal pain, bloating, constipation, diarrhea or blood in the stool.  GU: Denies urgency, frequency, pain with urination, burning sensation, blood in urine, odor or discharge. Musculoskeletal: Denies decrease in range of motion, difficulty with gait, muscle pain or joint pain and swelling.  Neurological: Denies dizziness, difficulty with memory, difficulty with speech or problems with balance and coordination.  Psych: Denies anxiety, depression, SI/HI.  No other specific complaints in a complete review of systems (except as listed in HPI above).  Objective:  PE:   BP 124/82   Pulse 67   Temp 98 F (36.7 C) (Oral)   Ht 5' 9.25" (1.759 m)   Wt 240 lb 8 oz (109.1 kg)   SpO2 97%   BMI 35.26 kg/m   Wt Readings from Last 3 Encounters:  03/02/16 236 lb (107 kg)  01/02/15 241 lb (109.3 kg)  12/31/14 241 lb (109.3 kg)    General: Appears  his stated age, obese in NAD. Cardiovascular: Normal rate and rhythm. S1,S2 noted.  No murmur, rubs or gallops noted. No JVD or BLE edema. No carotid bruits noted. Pulmonary/Chest: Normal effort and positive vesicular breath sounds. No respiratory distress. No wheezes, rales or ronchi noted.  Neurological: Alert and oriented.  Psychiatric: Mood and affect normal. Behavior is normal. Judgment and thought content normal.     BMET  Component Value Date/Time   NA 139 03/02/2016 1109   K 4.0 03/02/2016 1109   CL 107 03/02/2016 1109   CO2 26 03/02/2016 1109   GLUCOSE 95 03/02/2016 1109   BUN 15 03/02/2016 1109   CREATININE 0.95 03/02/2016 1109   CALCIUM 9.0 03/02/2016 1109   GFRNONAA >60 01/03/2015 0529   GFRAA >60 01/03/2015 0529    Lipid Panel  No results found for: CHOL, TRIG, HDL, CHOLHDL, VLDL, LDLCALC  CBC    Component Value Date/Time   WBC 6.1 12/31/2014 1025   RBC 4.89 12/31/2014 1025   HGB 14.1 01/03/2015 0529   HCT  42.8 01/03/2015 0529   PLT 265 12/31/2014 1025   MCV 89.0 12/31/2014 1025   MCH 29.4 12/31/2014 1025   MCHC 33.1 12/31/2014 1025   RDW 13.3 12/31/2014 1025   LYMPHSABS 1.4 11/10/2014 0620   MONOABS 0.8 11/10/2014 0620   EOSABS 0.1 11/10/2014 0620   BASOSABS 0.0 11/10/2014 0620    Hgb A1C No results found for: HGBA1C    Assessment and Plan:   Medicare Annual Wellness Visit:  Diet: He eats lean meat. He consumes fruits and veggies most days. He tries to avoid fried foods. He drinks mostly water, sweet tea, Dr. Malachi Bonds. Physical activity: House work Depression/mood screen: Negative Hearing: Intact to whispered voice Visual acuity: Grossly normal ADLs: Capable Fall risk: None Home safety: Good Cognitive evaluation: Intact to orientation, naming, recall and repetition EOL planning: No adv directives, full code/ I agree  Preventative Medicine: He declines flu shot. Tetanus UTD. Referral placed to GI for screening colonoscopy. Encouraged him to consume a balanced diet and exercise regimen. Advised him to see an eye doctor and dentist annually. Will check CBC, CMET, Lipid, PSA, HIV and Hep C today.   Next appointment: 1 year annual exam    Webb Silversmith, NP

## 2016-04-13 NOTE — Patient Instructions (Signed)
 Health Maintenance, Male A healthy lifestyle and preventive care is important for your health and wellness. Ask your health care provider about what schedule of regular examinations is right for you. What should I know about weight and diet?  Eat a Healthy Diet  Eat plenty of vegetables, fruits, whole grains, low-fat dairy products, and lean protein.  Do not eat a lot of foods high in solid fats, added sugars, or salt. Maintain a Healthy Weight  Regular exercise can help you achieve or maintain a healthy weight. You should:  Do at least 150 minutes of exercise each week. The exercise should increase your heart rate and make you sweat (moderate-intensity exercise).  Do strength-training exercises at least twice a week. Watch Your Levels of Cholesterol and Blood Lipids  Have your blood tested for lipids and cholesterol every 5 years starting at 58 years of age. If you are at high risk for heart disease, you should start having your blood tested when you are 58 years old. You may need to have your cholesterol levels checked more often if:  Your lipid or cholesterol levels are high.  You are older than 58 years of age.  You are at high risk for heart disease. What should I know about cancer screening? Many types of cancers can be detected early and may often be prevented. Lung Cancer  You should be screened every year for lung cancer if:  You are a current smoker who has smoked for at least 30 years.  You are a former smoker who has quit within the past 15 years.  Talk to your health care provider about your screening options, when you should start screening, and how often you should be screened. Colorectal Cancer  Routine colorectal cancer screening usually begins at 58 years of age and should be repeated every 5-10 years until you are 58 years old. You may need to be screened more often if early forms of precancerous polyps or small growths are found. Your health care provider  may recommend screening at an earlier age if you have risk factors for colon cancer.  Your health care provider may recommend using home test kits to check for hidden blood in the stool.  A small camera at the end of a tube can be used to examine your colon (sigmoidoscopy or colonoscopy). This checks for the earliest forms of colorectal cancer. Prostate and Testicular Cancer  Depending on your age and overall health, your health care provider may do certain tests to screen for prostate and testicular cancer.  Talk to your health care provider about any symptoms or concerns you have about testicular or prostate cancer. Skin Cancer  Check your skin from head to toe regularly.  Tell your health care provider about any new moles or changes in moles, especially if:  There is a change in a mole's size, shape, or color.  You have a mole that is larger than a pencil eraser.  Always use sunscreen. Apply sunscreen liberally and repeat throughout the day.  Protect yourself by wearing long sleeves, pants, a wide-brimmed hat, and sunglasses when outside. What should I know about heart disease, diabetes, and high blood pressure?  If you are 18-39 years of age, have your blood pressure checked every 3-5 years. If you are 40 years of age or older, have your blood pressure checked every year. You should have your blood pressure measured twice-once when you are at a hospital or clinic, and once when you are not at   a hospital or clinic. Record the average of the two measurements. To check your blood pressure when you are not at a hospital or clinic, you can use:  An automated blood pressure machine at a pharmacy.  A home blood pressure monitor.  Talk to your health care provider about your target blood pressure.  If you are between 45-79 years old, ask your health care provider if you should take aspirin to prevent heart disease.  Have regular diabetes screenings by checking your fasting blood sugar  level.  If you are at a normal weight and have a low risk for diabetes, have this test once every three years after the age of 45.  If you are overweight and have a high risk for diabetes, consider being tested at a younger age or more often.  A one-time screening for abdominal aortic aneurysm (AAA) by ultrasound is recommended for men aged 65-75 years who are current or former smokers. What should I know about preventing infection? Hepatitis B  If you have a higher risk for hepatitis B, you should be screened for this virus. Talk with your health care provider to find out if you are at risk for hepatitis B infection. Hepatitis C  Blood testing is recommended for:  Everyone born from 1945 through 1965.  Anyone with known risk factors for hepatitis C. Sexually Transmitted Diseases (STDs)  You should be screened each year for STDs including gonorrhea and chlamydia if:  You are sexually active and are younger than 58 years of age.  You are older than 58 years of age and your health care provider tells you that you are at risk for this type of infection.  Your sexual activity has changed since you were last screened and you are at an increased risk for chlamydia or gonorrhea. Ask your health care provider if you are at risk.  Talk with your health care provider about whether you are at high risk of being infected with HIV. Your health care provider may recommend a prescription medicine to help prevent HIV infection. What else can I do?  Schedule regular health, dental, and eye exams.  Stay current with your vaccines (immunizations).  Do not use any tobacco products, such as cigarettes, chewing tobacco, and e-cigarettes. If you need help quitting, ask your health care provider.  Limit alcohol intake to no more than 2 drinks per day. One drink equals 12 ounces of beer, 5 ounces of wine, or 1 ounces of hard liquor.  Do not use street drugs.  Do not share needles.  Ask your health  care provider for help if you need support or information about quitting drugs.  Tell your health care provider if you often feel depressed.  Tell your health care provider if you have ever been abused or do not feel safe at home. This information is not intended to replace advice given to you by your health care provider. Make sure you discuss any questions you have with your health care provider. Document Released: 07/04/2007 Document Revised: 09/04/2015 Document Reviewed: 10/09/2014 Elsevier Interactive Patient Education  2017 [employer] Inc.  

## 2016-04-14 ENCOUNTER — Telehealth: Payer: Self-pay | Admitting: Internal Medicine

## 2016-04-14 LAB — HIV ANTIBODY (ROUTINE TESTING W REFLEX): HIV 1&2 Ab, 4th Generation: NONREACTIVE

## 2016-04-14 LAB — HEPATITIS C ANTIBODY: HCV AB: NEGATIVE

## 2016-04-14 NOTE — Telephone Encounter (Signed)
Patient returned Melanie's call. °

## 2016-04-14 NOTE — Telephone Encounter (Signed)
I have not called pt, his labs have not been resulted. I do not see any other encounter for phone call. I will call pt with results once received

## 2016-04-14 NOTE — Telephone Encounter (Signed)
Last filled 02/21/16--pt had CPE yesterday--please advise

## 2016-04-14 NOTE — Telephone Encounter (Signed)
Looks like Walter Koch called for GI referral

## 2016-06-20 ENCOUNTER — Encounter (HOSPITAL_COMMUNITY): Payer: Self-pay | Admitting: Emergency Medicine

## 2016-06-20 ENCOUNTER — Emergency Department (HOSPITAL_COMMUNITY): Payer: No Typology Code available for payment source

## 2016-06-20 ENCOUNTER — Emergency Department (HOSPITAL_COMMUNITY)
Admission: EM | Admit: 2016-06-20 | Discharge: 2016-06-21 | Disposition: A | Discharge status: Home / Self Care | Payer: No Typology Code available for payment source | Attending: Emergency Medicine | Admitting: Emergency Medicine

## 2016-06-20 DIAGNOSIS — R109 Unspecified abdominal pain: Secondary | ICD-10-CM | POA: Diagnosis not present

## 2016-06-20 DIAGNOSIS — S299XXA Unspecified injury of thorax, initial encounter: Secondary | ICD-10-CM | POA: Diagnosis not present

## 2016-06-20 DIAGNOSIS — S8992XA Unspecified injury of left lower leg, initial encounter: Secondary | ICD-10-CM | POA: Diagnosis present

## 2016-06-20 DIAGNOSIS — Y92410 Unspecified street and highway as the place of occurrence of the external cause: Secondary | ICD-10-CM | POA: Insufficient documentation

## 2016-06-20 DIAGNOSIS — Y999 Unspecified external cause status: Secondary | ICD-10-CM | POA: Diagnosis not present

## 2016-06-20 DIAGNOSIS — S61411A Laceration without foreign body of right hand, initial encounter: Secondary | ICD-10-CM | POA: Diagnosis not present

## 2016-06-20 DIAGNOSIS — Y939 Activity, unspecified: Secondary | ICD-10-CM | POA: Diagnosis not present

## 2016-06-20 DIAGNOSIS — S81812A Laceration without foreign body, left lower leg, initial encounter: Secondary | ICD-10-CM | POA: Insufficient documentation

## 2016-06-20 DIAGNOSIS — S3991XA Unspecified injury of abdomen, initial encounter: Secondary | ICD-10-CM | POA: Insufficient documentation

## 2016-06-20 DIAGNOSIS — S6991XA Unspecified injury of right wrist, hand and finger(s), initial encounter: Secondary | ICD-10-CM | POA: Diagnosis not present

## 2016-06-20 LAB — COMPREHENSIVE METABOLIC PANEL
ALK PHOS: 70 U/L (ref 38–126)
ALT: 32 U/L (ref 17–63)
AST: 35 U/L (ref 15–41)
Albumin: 3.8 g/dL (ref 3.5–5.0)
Anion gap: 7 (ref 5–15)
BUN: 13 mg/dL (ref 6–20)
CALCIUM: 9.3 mg/dL (ref 8.9–10.3)
CHLORIDE: 110 mmol/L (ref 101–111)
CO2: 23 mmol/L (ref 22–32)
CREATININE: 1.18 mg/dL (ref 0.61–1.24)
Glucose, Bld: 98 mg/dL (ref 65–99)
Potassium: 4.2 mmol/L (ref 3.5–5.1)
Sodium: 140 mmol/L (ref 135–145)
Total Bilirubin: 1.3 mg/dL — ABNORMAL HIGH (ref 0.3–1.2)
Total Protein: 7.3 g/dL (ref 6.5–8.1)

## 2016-06-20 LAB — CBC
HEMATOCRIT: 45.5 % (ref 39.0–52.0)
HEMOGLOBIN: 15.7 g/dL (ref 13.0–17.0)
MCH: 30.6 pg (ref 26.0–34.0)
MCHC: 34.5 g/dL (ref 30.0–36.0)
MCV: 88.7 fL (ref 78.0–100.0)
Platelets: 259 10*3/uL (ref 150–400)
RBC: 5.13 MIL/uL (ref 4.22–5.81)
RDW: 13.2 % (ref 11.5–15.5)
WBC: 15.4 10*3/uL — AB (ref 4.0–10.5)

## 2016-06-20 LAB — LIPASE, BLOOD: LIPASE: 29 U/L (ref 11–51)

## 2016-06-20 MED ORDER — HYDROMORPHONE HCL 1 MG/ML IJ SOLN
1.0000 mg | Freq: Once | INTRAMUSCULAR | Status: AC
  Administered 2016-06-20: 1 mg via INTRAVENOUS
  Filled 2016-06-20: qty 1

## 2016-06-20 MED ORDER — CEPHALEXIN 500 MG PO CAPS: 500.0000 mg | ORAL_CAPSULE | Freq: Two times a day (BID) | ORAL | 0 refills | Status: AC

## 2016-06-20 MED ORDER — LIDOCAINE-EPINEPHRINE 1 %-1:100000 IJ SOLN
20.0000 mL | Freq: Once | INTRAMUSCULAR | Status: AC
  Administered 2016-06-20: 20 mL
  Filled 2016-06-20: qty 20

## 2016-06-20 MED ORDER — IOPAMIDOL (ISOVUE-300) INJECTION 61%
INTRAVENOUS | Status: AC
  Administered 2016-06-20: 100 mL
  Filled 2016-06-20: qty 100
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
## 2016-06-20 NOTE — ED Notes (Signed)
Provider at bedside suturing leg

## 2016-06-22 ENCOUNTER — Encounter: Payer: Self-pay | Admitting: Internal Medicine

## 2016-06-24 DIAGNOSIS — Z125 Encounter for screening for malignant neoplasm of prostate: Secondary | ICD-10-CM | POA: Diagnosis not present

## 2016-06-29 ENCOUNTER — Ambulatory Visit: Payer: Medicare Other | Admitting: Internal Medicine

## 2016-07-02 ENCOUNTER — Ambulatory Visit (INDEPENDENT_AMBULATORY_CARE_PROVIDER_SITE_OTHER): Payer: Medicare Other | Admitting: Internal Medicine

## 2016-07-02 ENCOUNTER — Encounter: Payer: Self-pay | Admitting: Internal Medicine

## 2016-07-02 VITALS — BP 140/88 | HR 74 | Temp 98.2°F | Wt 238.0 lb

## 2016-07-02 DIAGNOSIS — S61411D Laceration without foreign body of right hand, subsequent encounter: Secondary | ICD-10-CM | POA: Diagnosis not present

## 2016-07-02 DIAGNOSIS — S81812D Laceration without foreign body, left lower leg, subsequent encounter: Secondary | ICD-10-CM

## 2016-07-02 MED ORDER — FUROSEMIDE 20 MG PO TABS
20.0000 mg | ORAL_TABLET | Freq: Every day | ORAL | 0 refills | Status: DC
Start: 2016-07-02 — End: 2017-11-30

## 2016-07-02 NOTE — Patient Instructions (Signed)
Suture Removal, Care After Refer to this sheet in the next few weeks. These instructions provide you with information on caring for yourself after your procedure. Your health care provider may also give you more specific instructions. Your treatment has been planned according to current medical practices, but problems sometimes occur. Call your health care provider if you have any problems or questions after your procedure. What can I expect after the procedure? After your stitches (sutures) are removed, it is typical to have the following:  Some discomfort and swelling in the wound area.  Slight redness in the area.  Follow these instructions at home:  If you have skin adhesive strips over the wound area, do not take the strips off. They will fall off on their own in a few days. If the strips remain in place after 14 days, you may remove them.  Change any bandages (dressings) at least once a day or as directed by your health care provider. If the bandage sticks, soak it off with warm, soapy water.  Apply cream or ointment only as directed by your health care provider. If using cream or ointment, wash the area with soap and water 2 times a day to remove all the cream or ointment. Rinse off the soap and pat the area dry with a clean towel.  Keep the wound area dry and clean. If the bandage becomes wet or dirty, or if it develops a bad smell, change it as soon as possible.  Continue to protect the wound from injury.  Use sunscreen when out in the sun. New scars become sunburned easily. Contact a health care provider if:  You have increasing redness, swelling, or pain in the wound.  You see pus coming from the wound.  You have a fever.  You notice a bad smell coming from the wound or dressing.  Your wound breaks open (edges not staying together). This information is not intended to replace advice given to you by your health care provider. Make sure you discuss any questions you have  with your health care provider. Document Released: 09/30/2000 Document Revised: 06/13/2015 Document Reviewed: 08/17/2012 Elsevier Interactive Patient Education  2017 [employer]  
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
## 2016-07-03 DIAGNOSIS — N200 Calculus of kidney: Secondary | ICD-10-CM | POA: Diagnosis not present

## 2016-07-03 DIAGNOSIS — N400 Enlarged prostate without lower urinary tract symptoms: Secondary | ICD-10-CM | POA: Diagnosis not present

## 2017-04-15 ENCOUNTER — Ambulatory Visit: Payer: Medicare Other | Admitting: Internal Medicine

## 2017-11-30 ENCOUNTER — Encounter: Payer: Self-pay | Admitting: Internal Medicine

## 2017-11-30 ENCOUNTER — Ambulatory Visit (INDEPENDENT_AMBULATORY_CARE_PROVIDER_SITE_OTHER): Payer: Medicare Other | Admitting: Internal Medicine

## 2017-11-30 VITALS — BP 130/80 | HR 79 | Temp 98.2°F | Ht 69.25 in | Wt 233.0 lb

## 2017-11-30 DIAGNOSIS — Z125 Encounter for screening for malignant neoplasm of prostate: Secondary | ICD-10-CM

## 2017-11-30 DIAGNOSIS — Z1211 Encounter for screening for malignant neoplasm of colon: Secondary | ICD-10-CM | POA: Diagnosis not present

## 2017-11-30 DIAGNOSIS — Z0000 Encounter for general adult medical examination without abnormal findings: Secondary | ICD-10-CM | POA: Diagnosis not present

## 2017-11-30 DIAGNOSIS — E782 Mixed hyperlipidemia: Secondary | ICD-10-CM

## 2017-11-30 NOTE — Patient Instructions (Signed)

## 2017-11-30 NOTE — Progress Notes (Signed)
HPI:  Pt presents to the clinic today for his Medicare Wellness Exam.  Past Medical History:  Diagnosis Date  . History of transfusion   . Kidney stone     Current Outpatient Medications  Medication Sig Dispense Refill  . ibuprofen (ADVIL,MOTRIN) 200 MG tablet Take 400 mg by mouth every 6 (six) hours as needed for headache (pain).    . Omega 3 1200 MG CAPS Take 1 capsule by mouth daily.     No current facility-administered medications for this visit.     No Known Allergies  Family History  Problem Relation Age of Onset  . Colon cancer Mother 41  . Colon cancer Sister 79    Social History   Socioeconomic History  . Marital status: Married    Spouse name: Not on file  . Number of children: Not on file  . Years of education: Not on file  . Highest education level: Not on file  Occupational History  . Not on file  Social Needs  . Financial resource strain: Not on file  . Food insecurity:    Worry: Not on file    Inability: Not on file  . Transportation needs:    Medical: Not on file    Non-medical: Not on file  Tobacco Use  . Smoking status: Never Smoker  . Smokeless tobacco: Never Used  Substance and Sexual Activity  . Alcohol use: No  . Drug use: No  . Sexual activity: Yes  Lifestyle  . Physical activity:    Days per week: Not on file    Minutes per session: Not on file  . Stress: Not on file  Relationships  . Social connections:    Talks on phone: Not on file    Gets together: Not on file    Attends religious service: Not on file    Active member of club or organization: Not on file    Attends meetings of clubs or organizations: Not on file    Relationship status: Not on file  . Intimate partner violence:    Fear of current or ex partner: Not on file    Emotionally abused: Not on file    Physically abused: Not on file    Forced sexual activity: Not on file  Other Topics Concern  . Not on file  Social History Narrative  . Not on file     Hospitiliaztions: None  Health Maintenance:    Flu: never  Tetanus: 2017  PSA Screening: 03/2016  Colon Screening: never  Vision Screening: as needed  Dentist: biannually   Providers:   PCP: Webb Silversmith, NP-C  Urology: Dr. Tresa Moore  I have personally reviewed and have noted:  1. The patient's medical and social history 2. Their use of alcohol, tobacco or illicit drugs 3. Their current medications and supplements 4. The patient's functional ability including ADL's, fall risks, home safety risks and hearing or visual impairment. 5. Diet and physical activities 6. Evidence for depression or mood disorder  Subjective:   Review of Systems:   Constitutional: Denies fever, malaise, fatigue, headache or abrupt weight changes.  HEENT: Denies eye pain, eye redness, ear pain, ringing in the ears, wax buildup, runny nose, nasal congestion, bloody nose, or sore throat. Respiratory: Denies difficulty breathing, shortness of breath, cough or sputum production.   Cardiovascular: Denies chest pain, chest tightness, palpitations or swelling in the hands or feet.  Gastrointestinal: Pt reports intermittent reflux. Denies abdominal pain, bloating, constipation, diarrhea or blood in the stool.  GU: Denies urgency, frequency, pain with urination, burning sensation, blood in urine, odor or discharge. Musculoskeletal: Pt reports intermittent joint pain. Denies decrease in range of motion, difficulty with gait, muscle pain or joint swelling.  Skin: Denies redness, rashes, lesions or ulcercations.  Neurological: Denies dizziness, difficulty with memory, difficulty with speech or problems with balance and coordination.  Psych: Denies anxiety, depression, SI/HI.  No other specific complaints in a complete review of systems (except as listed in HPI above).  Objective:  PE:   BP 130/80   Pulse 79   Temp 98.2 F (36.8 C) (Oral)   Ht 5' 9.25" (1.759 m)   Wt 233 lb (105.7 kg)   SpO2 98%   BMI  34.16 kg/m  Wt Readings from Last 3 Encounters:  11/30/17 233 lb (105.7 kg)  07/02/16 238 lb (108 kg)  06/20/16 242 lb (109.8 kg)    General: Appears his stated age, obese, in NAD. Skin: Warm, dry and intact.  HEENT: Head: normal shape and size; Eyes: sclera white, no icterus, conjunctiva pink, PERRLA and EOMs intact; Ears: Tm's gray and intact, normal light reflex; Throat/Mouth: Teeth present, mucosa pink and moist, no exudate, lesions or ulcerations noted.  Neck: Neck supple, trachea midline. No masses, lumps or thyromegaly present.  Cardiovascular: Normal rate and rhythm. S1,S2 noted.  No murmur, rubs or gallops noted. No JVD or BLE edema. No carotid bruits noted. Pulmonary/Chest: Normal effort and positive vesicular breath sounds. No respiratory distress. No wheezes, rales or ronchi noted.  Abdomen: Soft and nontender. Normal bowel sounds. No distention or masses noted. Liver, spleen and kidneys non palpable. Musculoskeletal: Strength 5/5 BUE/BLE. No signs of joint swelling.  Neurological: Alert and oriented. Cranial nerves II-XII grossly intact. Coordination normal.  Psychiatric: Mood and affect normal. Behavior is normal. Judgment and thought content normal.     BMET    Component Value Date/Time   NA 140 06/20/2016 1810   K 4.2 06/20/2016 1810   CL 110 06/20/2016 1810   CO2 23 06/20/2016 1810   GLUCOSE 98 06/20/2016 1810   BUN 13 06/20/2016 1810   CREATININE 1.18 06/20/2016 1810   CALCIUM 9.3 06/20/2016 1810   GFRNONAA >60 06/20/2016 1810   GFRAA >60 06/20/2016 1810    Lipid Panel     Component Value Date/Time   CHOL 229 (H) 04/13/2016 1115   TRIG 288.0 (H) 04/13/2016 1115   HDL 30.10 (L) 04/13/2016 1115   CHOLHDL 8 04/13/2016 1115   VLDL 57.6 (H) 04/13/2016 1115    CBC    Component Value Date/Time   WBC 15.4 (H) 06/20/2016 1810   RBC 5.13 06/20/2016 1810   HGB 15.7 06/20/2016 1810   HCT 45.5 06/20/2016 1810   PLT 259 06/20/2016 1810   MCV 88.7 06/20/2016  1810   MCH 30.6 06/20/2016 1810   MCHC 34.5 06/20/2016 1810   RDW 13.2 06/20/2016 1810   LYMPHSABS 1.4 11/10/2014 0620   MONOABS 0.8 11/10/2014 0620   EOSABS 0.1 11/10/2014 0620   BASOSABS 0.0 11/10/2014 0620    Hgb A1C No results found for: HGBA1C    Assessment and Plan:   Medicare Annual Wellness Visit:  Diet: He does eat meat. He consumes some fruits and veggies. He does eat fried foods. He drinks mostly sweet tea, some water. Physical activity: Sedentary Depression/mood screen: Negative Hearing: Intact to whispered voice Visual acuity: Grossly normal ADLs: Capable Fall risk: None Home safety: Good Cognitive evaluation: Intact to orientation, naming, recall and repetition EOL planning:  No adv directives, full code/ I agree  Preventative Medicine: He declines flu shot. Tetanus UTD. PSA will be done with screening labs. Referral placed to GI for screening colonoscopy. Encouraged him to consume a balanced diet and exercise regimen. Advised him to see an eye doctor and dentist annually. Will check CBC, CMET, Lipid and PSA.   Next appointment: RTC in 1 year, sooner if needed    Webb Silversmith, NP

## 2017-11-30 NOTE — Progress Notes (Signed)
Subjective:    Patient ID: JACKOB CROOKSTON, male    DOB: 1960-11-19, 57 y.o.   MRN: [id number]  HPI  Pt presents to the clinic today for his annual exam.  Flu: never Tetanus: 2017 PSA Screening: 03/2016 Colon Screening: never Vision Screening: as needed Dentist: biannually  Diet: Exercise:  Review of Systems      Past Medical History:  Diagnosis Date  . History of transfusion   . Kidney stone     Current Outpatient Medications  Medication Sig Dispense Refill  . ibuprofen (ADVIL,MOTRIN) 200 MG tablet Take 400 mg by mouth every 6 (six) hours as needed for headache (pain).    . furosemide (LASIX) 20 MG tablet Take 1 tablet (20 mg total) by mouth daily. (Patient not taking: Reported on 11/30/2017) 5 tablet 0   No current facility-administered medications for this visit.     No Known Allergies  Family History  Problem Relation Age of Onset  . Colon cancer Mother 12  . Colon cancer Sister 65    Social History   Socioeconomic History  . Marital status: Married    Spouse name: Not on file  . Number of children: Not on file  . Years of education: Not on file  . Highest education level: Not on file  Occupational History  . Not on file  Social Needs  . Financial resource strain: Not on file  . Food insecurity:    Worry: Not on file    Inability: Not on file  . Transportation needs:    Medical: Not on file    Non-medical: Not on file  Tobacco Use  . Smoking status: Never Smoker  . Smokeless tobacco: Never Used  Substance and Sexual Activity  . Alcohol use: No  . Drug use: No  . Sexual activity: Yes  Lifestyle  . Physical activity:    Days per week: Not on file    Minutes per session: Not on file  . Stress: Not on file  Relationships  . Social connections:    Talks on phone: Not on file    Gets together: Not on file    Attends religious service: Not on file    Active member of club or organization: Not on file    Attends meetings of clubs or  organizations: Not on file    Relationship status: Not on file  . Intimate partner violence:    Fear of current or ex partner: Not on file    Emotionally abused: Not on file    Physically abused: Not on file    Forced sexual activity: Not on file  Other Topics Concern  . Not on file  Social History Narrative  . Not on file     Constitutional: Denies fever, malaise, fatigue, headache or abrupt weight changes.  HEENT: Denies eye pain, eye redness, ear pain, ringing in the ears, wax buildup, runny nose, nasal congestion, bloody nose, or sore throat. Respiratory: Denies difficulty breathing, shortness of breath, cough or sputum production.   Cardiovascular: Denies chest pain, chest tightness, palpitations or swelling in the hands or feet.  Gastrointestinal: Denies abdominal pain, bloating, constipation, diarrhea or blood in the stool.  GU: Denies urgency, frequency, pain with urination, burning sensation, blood in urine, odor or discharge. Musculoskeletal: Denies decrease in range of motion, difficulty with gait, muscle pain or joint pain and swelling.  Skin: Denies redness, rashes, lesions or ulcercations.  Neurological: Denies dizziness, difficulty with memory, difficulty with speech or problems with  balance and coordination.  Psych: Denies anxiety, depression, SI/HI.  No other specific complaints in a complete review of systems (except as listed in HPI above).  Objective:   Physical Exam        Assessment & Plan:

## 2017-12-01 LAB — LIPID PANEL
CHOL/HDL RATIO: 9
Cholesterol: 261 mg/dL — ABNORMAL HIGH (ref 0–200)
HDL: 30 mg/dL — AB (ref >39.00)
Triglycerides: 493 mg/dL — ABNORMAL HIGH (ref 0.0–149.0)

## 2017-12-01 LAB — COMPREHENSIVE METABOLIC PANEL
ALT: 27 U/L (ref 0–53)
AST: 26 U/L (ref 0–37)
Albumin: 4.7 g/dL (ref 3.5–5.2)
Alkaline Phosphatase: 77 U/L (ref 39–117)
BILIRUBIN TOTAL: 0.5 mg/dL (ref 0.2–1.2)
BUN: 20 mg/dL (ref 6–23)
CO2: 31 meq/L (ref 19–32)
Calcium: 9.8 mg/dL (ref 8.4–10.5)
Chloride: 105 mEq/L (ref 96–112)
Creatinine, Ser: 0.99 mg/dL (ref 0.40–1.50)
GFR: 82.21 mL/min (ref >60.00)
Glucose, Bld: 98 mg/dL (ref 70–99)
POTASSIUM: 4.7 meq/L (ref 3.5–5.1)
SODIUM: 143 meq/L (ref 135–145)
Total Protein: 7.5 g/dL (ref 6.0–8.3)

## 2017-12-01 LAB — CBC
HCT: 44.6 % (ref 39.0–52.0)
HEMOGLOBIN: 15.3 g/dL (ref 13.0–17.0)
MCHC: 34.4 g/dL (ref 30.0–36.0)
MCV: 89.6 fl (ref 78.0–100.0)
Platelets: 294 10*3/uL (ref 150.0–400.0)
RBC: 4.98 Mil/uL (ref 4.22–5.81)
RDW: 13.5 % (ref 11.5–15.5)
WBC: 8.9 10*3/uL (ref 4.0–10.5)

## 2017-12-01 LAB — PSA, MEDICARE: PSA: 0.94 ng/ml (ref 0.10–4.00)

## 2017-12-01 LAB — LDL CHOLESTEROL, DIRECT: LDL DIRECT: 81 mg/dL

## 2017-12-02 ENCOUNTER — Encounter: Payer: Self-pay | Admitting: Internal Medicine

## 2017-12-03 ENCOUNTER — Telehealth: Payer: Self-pay

## 2017-12-03 MED ORDER — FENOFIBRATE 54 MG PO TABS: 54.0000 mg | ORAL_TABLET | Freq: Every day | ORAL | 2 refills | Status: DC

## 2017-12-03 MED ORDER — ROSUVASTATIN CALCIUM 5 MG PO TABS: 5.0000 mg | ORAL_TABLET | Freq: Every day | ORAL | 2 refills | Status: DC

## 2017-12-03 NOTE — Telephone Encounter (Signed)
Pt is aware as instructed and expressed understanding... I will send Rx to pharmacy and pt will let me know next week if they are not covered or affordable

## 2017-12-03 NOTE — Telephone Encounter (Signed)
-----   Message from Jearld Fenton, NP sent at 12/03/2017 12:38 PM EST ----- Could we try Fenofibrate 54 mg 1 tab po daily and Rosuvastatin 5 mg 1 tab PO daily?

## 2017-12-03 NOTE — Addendum Note (Signed)
Addended by: Lurlean Nanny on: 12/03/2017 01:03 PM   Modules accepted: Orders

## 2017-12-07 DIAGNOSIS — E782 Mixed hyperlipidemia: Secondary | ICD-10-CM | POA: Insufficient documentation

## 2017-12-07 NOTE — Assessment & Plan Note (Signed)
CBC, CMET and Lipid profile today May need to add statin, fenofibrate if cholesterol elevated Encouraged him to consume a low fat diet.

## 2018-01-04 ENCOUNTER — Ambulatory Visit (AMBULATORY_SURGERY_CENTER): Payer: Self-pay | Admitting: *Deleted

## 2018-01-04 VITALS — Ht 70.0 in | Wt 230.0 lb

## 2018-01-04 DIAGNOSIS — Z800 Family history of malignant neoplasm of digestive organs: Secondary | ICD-10-CM

## 2018-01-04 MED ORDER — PEG-KCL-NACL-NASULF-NA ASC-C 140 G PO SOLR: 1.0000 | ORAL | 0 refills | Status: DC

## 2018-01-04 NOTE — Progress Notes (Signed)
No egg or soy allergy known to patient  No issues with past sedation with any surgeries  or procedures, no intubation problems  No diet pills per patient No home 02 use per patient  No blood thinners per patient  Pt denies issues with constipation  No A fib or A flutter  EMMI video sent to pt's e mail -  Plenvu sample- Pt has Medicare A/B   Lot [id number]  Exp 07/2018  As directed

## 2018-01-18 ENCOUNTER — Ambulatory Visit (AMBULATORY_SURGERY_CENTER): Payer: Medicare Other | Admitting: Internal Medicine

## 2018-01-18 ENCOUNTER — Encounter: Payer: Self-pay | Admitting: Internal Medicine

## 2018-01-18 VITALS — BP 97/71 | HR 73 | Temp 97.3°F | Resp 12 | Ht 70.0 in | Wt 230.0 lb

## 2018-01-18 DIAGNOSIS — D120 Benign neoplasm of cecum: Secondary | ICD-10-CM | POA: Diagnosis not present

## 2018-01-18 DIAGNOSIS — Z800 Family history of malignant neoplasm of digestive organs: Secondary | ICD-10-CM | POA: Diagnosis not present

## 2018-01-18 DIAGNOSIS — D122 Benign neoplasm of ascending colon: Secondary | ICD-10-CM | POA: Diagnosis not present

## 2018-01-18 DIAGNOSIS — D123 Benign neoplasm of transverse colon: Secondary | ICD-10-CM | POA: Diagnosis not present

## 2018-01-18 DIAGNOSIS — Z1211 Encounter for screening for malignant neoplasm of colon: Secondary | ICD-10-CM

## 2018-01-18 DIAGNOSIS — D128 Benign neoplasm of rectum: Secondary | ICD-10-CM

## 2018-01-18 DIAGNOSIS — D129 Benign neoplasm of anus and anal canal: Secondary | ICD-10-CM

## 2018-01-18 MED ORDER — SODIUM CHLORIDE 0.9 % IV SOLN: 500.0000 mL | Freq: Once | INTRAVENOUS | Status: DC

## 2018-01-18 NOTE — Op Note (Signed)
[facility] Patient Name: Walter Koch Procedure Date: 01/18/2018 8:50 AM MRN: [id number] Endoscopist: Docia Chuck. Henrene Pastor , MD Age: 57 Referring MD:  Date of Birth: Jan 11, 1961 Gender: Male Account #: [id number] Procedure:                Colonoscopy with cold snare polypectomy x 2; with                            hot snare polypectomy x 1 Indications:              Screening in patient at increased risk: Colorectal                            cancer in mother 52 or older, Screening in patient                            at increased risk: Colorectal cancer in sister                            before age 44 Medicines:                Monitored Anesthesia Care Procedure:                Pre-Anesthesia Assessment:                           - Prior to the procedure, a History and Physical                            was performed, and patient medications and                            allergies were reviewed. The patient's tolerance of                            previous anesthesia was also reviewed. The risks                            and benefits of the procedure and the sedation                            options and risks were discussed with the patient.                            All questions were answered, and informed consent                            was obtained. Prior Anticoagulants: The patient has                            taken no previous anticoagulant or antiplatelet                            agents. ASA Grade Assessment: II - A patient with  mild systemic disease. After reviewing the risks                            and benefits, the patient was deemed in                            satisfactory condition to undergo the procedure.                           After obtaining informed consent, the colonoscope                            was passed under direct vision. Throughout the                            procedure, the patient's blood  pressure, pulse, and                            oxygen saturations were monitored continuously. The                            Colonoscope was introduced through the anus and                            advanced to the the cecum, identified by                            appendiceal orifice and ileocecal valve. The                            ileocecal valve, appendiceal orifice, and rectum                            were photographed. The quality of the bowel                            preparation was excellent. The colonoscopy was                            performed without difficulty. The patient tolerated                            the procedure well. The bowel preparation used was                            SUPREP. Scope In: 8:55:39 AM Scope Out: 9:13:47 AM Scope Withdrawal Time: 0 hours 14 minutes 2 seconds  Total Procedure Duration: 0 hours 18 minutes 8 seconds  Findings:                 A 15 mm polyp was found in the rectum. The polyp                            was semi-pedunculated. The polyp was removed with a  hot snare. Resection and retrieval were complete.                           Two polyps were found in the transverse colon and                            ascending colon. The polyps were 3 to 5 mm in size.                            These polyps were removed with a cold snare.                            Resection and retrieval were complete.                           Multiple diverticula were found in the left colon.                           The exam was otherwise without abnormality on                            direct and retroflexion views. Complications:            No immediate complications. Estimated blood loss:                            None. Estimated Blood Loss:     Estimated blood loss: none. Impression:               - One 15 mm polyp in the rectum, removed with a hot                            snare. Resected and retrieved.                            - Two 3 to 5 mm polyps in the transverse colon and                            in the ascending colon, removed with a cold snare.                            Resected and retrieved.                           - Diverticulosis in the left colon.                           - The examination was otherwise normal on direct                            and retroflexion views. Recommendation:           - Repeat colonoscopy in 3 years for surveillance.                           -  Patient has a contact number available for                            emergencies. The signs and symptoms of potential                            delayed complications were discussed with the                            patient. Return to normal activities tomorrow.                            Written discharge instructions were provided to the                            patient.                           - Resume previous diet.                           - Continue present medications.                           - Await pathology results. Docia Chuck. Henrene Pastor, MD 01/18/2018 9:21:05 AM This report has been signed electronically.

## 2018-01-18 NOTE — Progress Notes (Signed)
PT taken to PACU. Monitors in place. VSS. Report given to RN. 

## 2018-01-18 NOTE — Progress Notes (Signed)
Called to room to assist during endoscopic procedure.  Patient ID and intended procedure confirmed with present staff. Received instructions for my participation in the procedure from the performing physician.  

## 2018-01-18 NOTE — Progress Notes (Signed)
Pt's states no medical or surgical changes since previsit or office visit. 

## 2018-01-18 NOTE — Patient Instructions (Signed)
Handout given on polyps and diverticulosis. You had 3 removed today. Repeat colonoscopy in 3 years.   YOU HAD AN ENDOSCOPIC PROCEDURE TODAY AT [facility]:   Refer to the procedure report that was given to you for any specific questions about what was found during the examination.  If the procedure report does not answer your questions, please call your gastroenterologist to clarify.  If you requested that your care partner not be given the details of your procedure findings, then the procedure report has been included in a sealed envelope for you to review at your convenience later.  YOU SHOULD EXPECT: Some feelings of bloating in the abdomen. Passage of more gas than usual.  Walking can help get rid of the air that was put into your GI tract during the procedure and reduce the bloating. If you had a lower endoscopy (such as a colonoscopy or flexible sigmoidoscopy) you may notice spotting of blood in your stool or on the toilet paper. If you underwent a bowel prep for your procedure, you may not have a normal bowel movement for a few days.  Please Note:  You might notice some irritation and congestion in your nose or some drainage.  This is from the oxygen used during your procedure.  There is no need for concern and it should clear up in a day or so.  SYMPTOMS TO REPORT IMMEDIATELY:   Following lower endoscopy (colonoscopy or flexible sigmoidoscopy):  Excessive amounts of blood in the stool  Significant tenderness or worsening of abdominal pains  Swelling of the abdomen that is new, acute  Fever of 100F or higher   For urgent or emergent issues, a gastroenterologist can be reached at any hour by calling [phone].   DIET:  We do recommend a small meal at first, but then you may proceed to your regular diet.  Drink plenty of fluids but you should avoid alcoholic beverages for 24 hours.  ACTIVITY:  You should plan to take it easy for the rest of today and you should  NOT DRIVE or use heavy machinery until tomorrow (because of the sedation medicines used during the test).    FOLLOW UP: Our staff will call the number listed on your records the next business day following your procedure to check on you and address any questions or concerns that you may have regarding the information given to you following your procedure. If we do not reach you, we will leave a message.  However, if you are feeling well and you are not experiencing any problems, there is no need to return our call.  We will assume that you have returned to your regular daily activities without incident.  If any biopsies were taken you will be contacted by phone or by letter within the next 1-3 weeks.  Please call us at [phone] if you have not heard about the biopsies in 3 weeks.    SIGNATURES/CONFIDENTIALITY: You and/or your care partner have signed paperwork which will be entered into your electronic medical record.  These signatures attest to the fact that that the information above on your After Visit Summary has been reviewed and is understood.  Full responsibility of the confidentiality of this discharge information lies with you and/or your care-partner.

## 2018-01-20 ENCOUNTER — Telehealth: Payer: Self-pay

## 2018-01-20 ENCOUNTER — Telehealth: Payer: Self-pay | Admitting: *Deleted

## 2018-01-20 NOTE — Telephone Encounter (Signed)
  Follow up Call-  Call back number 01/18/2018  Post procedure Call Back phone  # [phone]  Permission to leave phone message Yes  Some recent data might be hidden     Patient questions:  Message left to call us if necessary.

## 2018-01-20 NOTE — Telephone Encounter (Signed)
  Follow up Call-  Call back number 01/18/2018  Post procedure Call Back phone  # [phone]  Permission to leave phone message Yes  Some recent data might be hidden     Patient questions:  Do you have a fever, pain , or abdominal swelling? No. Pain Score  0 *  Have you tolerated food without any problems? Yes.    Have you been able to return to your normal activities? Yes.    Do you have any questions about your discharge instructions: Diet   No. Medications  No. Follow up visit  No.  Do you have questions or concerns about your Care? No.  Actions: * If pain score is 4 or above: No action needed, pain <4.

## 2018-01-25 ENCOUNTER — Encounter: Payer: Self-pay | Admitting: Internal Medicine

## 2018-03-18 ENCOUNTER — Encounter: Payer: Self-pay | Admitting: Internal Medicine

## 2018-03-18 ENCOUNTER — Other Ambulatory Visit: Payer: Self-pay | Admitting: Internal Medicine

## 2018-03-18 DIAGNOSIS — E782 Mixed hyperlipidemia: Secondary | ICD-10-CM

## 2018-03-21 ENCOUNTER — Other Ambulatory Visit (INDEPENDENT_AMBULATORY_CARE_PROVIDER_SITE_OTHER): Payer: Medicare Other

## 2018-03-21 DIAGNOSIS — E782 Mixed hyperlipidemia: Secondary | ICD-10-CM | POA: Diagnosis not present

## 2018-03-21 LAB — LIPID PANEL
Cholesterol: 105 mg/dL (ref 0–200)
HDL: 32.6 mg/dL — ABNORMAL LOW (ref >39.00)
LDL Cholesterol: 47 mg/dL (ref 0–99)
NonHDL: 72.84
TRIGLYCERIDES: 129 mg/dL (ref 0.0–149.0)
Total CHOL/HDL Ratio: 3
VLDL: 25.8 mg/dL (ref 0.0–40.0)

## 2018-04-11 ENCOUNTER — Other Ambulatory Visit: Payer: Self-pay | Admitting: Internal Medicine

## 2018-04-11 DIAGNOSIS — E782 Mixed hyperlipidemia: Secondary | ICD-10-CM

## 2018-04-18 ENCOUNTER — Encounter: Payer: Self-pay | Admitting: Internal Medicine

## 2018-04-18 DIAGNOSIS — E782 Mixed hyperlipidemia: Secondary | ICD-10-CM

## 2018-04-21 MED ORDER — FENOFIBRATE 54 MG PO TABS: 54.0000 mg | ORAL_TABLET | Freq: Every day | ORAL | 2 refills | Status: DC

## 2018-04-21 MED ORDER — ROSUVASTATIN CALCIUM 5 MG PO TABS: 5.0000 mg | ORAL_TABLET | Freq: Every day | ORAL | 2 refills | Status: DC

## 2018-07-23 ENCOUNTER — Encounter: Payer: Self-pay | Admitting: Internal Medicine

## 2018-07-23 DIAGNOSIS — E782 Mixed hyperlipidemia: Secondary | ICD-10-CM

## 2018-07-24 MED ORDER — FENOFIBRATE 54 MG PO TABS: 54.0000 mg | ORAL_TABLET | Freq: Every day | ORAL | 2 refills | Status: DC

## 2018-07-24 MED ORDER — ROSUVASTATIN CALCIUM 5 MG PO TABS: 5.0000 mg | ORAL_TABLET | Freq: Every day | ORAL | 2 refills | Status: DC

## 2018-07-25 ENCOUNTER — Other Ambulatory Visit: Payer: Self-pay | Admitting: Internal Medicine

## 2018-07-25 DIAGNOSIS — E782 Mixed hyperlipidemia: Secondary | ICD-10-CM

## 2018-07-27 MED ORDER — ROSUVASTATIN CALCIUM 5 MG PO TABS: 5.0000 mg | ORAL_TABLET | Freq: Every day | ORAL | 2 refills | Status: DC

## 2018-07-27 MED ORDER — FENOFIBRATE 54 MG PO TABS: 54.0000 mg | ORAL_TABLET | Freq: Every day | ORAL | 2 refills | Status: DC

## 2018-07-27 NOTE — Addendum Note (Signed)
Addended by: Lurlean Nanny on: 07/27/2018 01:37 PM   Modules accepted: Orders

## 2018-10-19 ENCOUNTER — Other Ambulatory Visit: Payer: Self-pay | Admitting: Internal Medicine

## 2018-10-19 DIAGNOSIS — E782 Mixed hyperlipidemia: Secondary | ICD-10-CM

## 2018-12-05 ENCOUNTER — Other Ambulatory Visit: Payer: Self-pay

## 2018-12-05 ENCOUNTER — Encounter: Payer: Self-pay | Admitting: Internal Medicine

## 2018-12-05 ENCOUNTER — Ambulatory Visit (INDEPENDENT_AMBULATORY_CARE_PROVIDER_SITE_OTHER): Payer: Medicare Other | Admitting: Internal Medicine

## 2018-12-05 VITALS — BP 130/84 | HR 79 | Temp 97.6°F | Ht 69.5 in | Wt 228.0 lb

## 2018-12-05 DIAGNOSIS — Z125 Encounter for screening for malignant neoplasm of prostate: Secondary | ICD-10-CM

## 2018-12-05 DIAGNOSIS — E782 Mixed hyperlipidemia: Secondary | ICD-10-CM | POA: Diagnosis not present

## 2018-12-05 DIAGNOSIS — Z0000 Encounter for general adult medical examination without abnormal findings: Secondary | ICD-10-CM

## 2018-12-05 LAB — COMPREHENSIVE METABOLIC PANEL
ALT: 27 U/L (ref 0–53)
AST: 23 U/L (ref 0–37)
Albumin: 4.6 g/dL (ref 3.5–5.2)
Alkaline Phosphatase: 58 U/L (ref 39–117)
BUN: 17 mg/dL (ref 6–23)
CO2: 25 mEq/L (ref 19–32)
Calcium: 9.4 mg/dL (ref 8.4–10.5)
Chloride: 108 mEq/L (ref 96–112)
Creatinine, Ser: 1.05 mg/dL (ref 0.40–1.50)
GFR: 72.02 mL/min (ref >60.00)
Glucose, Bld: 99 mg/dL (ref 70–99)
Potassium: 4.3 mEq/L (ref 3.5–5.1)
Sodium: 142 mEq/L (ref 135–145)
Total Bilirubin: 0.8 mg/dL (ref 0.2–1.2)
Total Protein: 7.2 g/dL (ref 6.0–8.3)

## 2018-12-05 LAB — CBC
HCT: 45 % (ref 39.0–52.0)
Hemoglobin: 15 g/dL (ref 13.0–17.0)
MCHC: 33.4 g/dL (ref 30.0–36.0)
MCV: 91.8 fl (ref 78.0–100.0)
Platelets: 286 10*3/uL (ref 150.0–400.0)
RBC: 4.9 Mil/uL (ref 4.22–5.81)
RDW: 13.7 % (ref 11.5–15.5)
WBC: 6.3 10*3/uL (ref 4.0–10.5)

## 2018-12-05 LAB — LIPID PANEL
Cholesterol: 121 mg/dL (ref 0–200)
HDL: 29.5 mg/dL — ABNORMAL LOW (ref >39.00)
LDL Cholesterol: 59 mg/dL (ref 0–99)
NonHDL: 91.33
Total CHOL/HDL Ratio: 4
Triglycerides: 163 mg/dL — ABNORMAL HIGH (ref 0.0–149.0)
VLDL: 32.6 mg/dL (ref 0.0–40.0)

## 2018-12-05 LAB — PSA, MEDICARE: PSA: 1.17 ng/ml (ref 0.10–4.00)

## 2018-12-05 NOTE — Assessment & Plan Note (Signed)
CMET and lipid profile today Encouraged him to consume a low fat diet Continue Rosuvastatin, Fish Oil and Fenofibrate for now

## 2018-12-05 NOTE — Patient Instructions (Signed)

## 2018-12-05 NOTE — Progress Notes (Signed)
HPI:  Pt presents to the clinic today for his subsequent annual Medicare Wellness Exam.  HLD: His last LDL was 47, triglycerides 129, 03/2018. He is taking Rosuvastatin, Fenofibrate and Fish Oil as prescribed. He denies myalgias. He tries to consume a low fat diet.   Past Medical History:  Diagnosis Date  . Arthritis   . History of transfusion   . Hyperlipidemia   . Kidney stone     Current Outpatient Medications  Medication Sig Dispense Refill  . fenofibrate 54 MG tablet Take 1 tablet by mouth once daily 30 tablet 1  . ibuprofen (ADVIL,MOTRIN) 200 MG tablet Take 400 mg by mouth every 6 (six) hours as needed for headache (pain).    . Omega 3 1200 MG CAPS Take 1 capsule by mouth daily.    . rosuvastatin (CRESTOR) 5 MG tablet Take 1 tablet by mouth once daily 30 tablet 1   No current facility-administered medications for this visit.     No Known Allergies  Family History  Problem Relation Age of Onset  . Colon cancer Mother 74  . Colon cancer Sister 68  . Colon polyps Sister   . Esophageal cancer Neg Hx   . Rectal cancer Neg Hx   . Stomach cancer Neg Hx     Social History   Socioeconomic History  . Marital status: Married    Spouse name: Not on file  . Number of children: Not on file  . Years of education: Not on file  . Highest education level: Not on file  Occupational History  . Not on file  Social Needs  . Financial resource strain: Not on file  . Food insecurity    Worry: Not on file    Inability: Not on file  . Transportation needs    Medical: Not on file    Non-medical: Not on file  Tobacco Use  . Smoking status: Never Smoker  . Smokeless tobacco: Never Used  Substance and Sexual Activity  . Alcohol use: No  . Drug use: No  . Sexual activity: Yes  Lifestyle  . Physical activity    Days per week: Not on file    Minutes per session: Not on file  . Stress: Not on file  Relationships  . Social Herbalist on phone: Not on file    Gets  together: Not on file    Attends religious service: Not on file    Active member of club or organization: Not on file    Attends meetings of clubs or organizations: Not on file    Relationship status: Not on file  . Intimate partner violence    Fear of current or ex partner: Not on file    Emotionally abused: Not on file    Physically abused: Not on file    Forced sexual activity: Not on file  Other Topics Concern  . Not on file  Social History Narrative  . Not on file    Hospitiliaztions: None  Health Maintenance:    Flu: never  Tetanus: 02/2015  PSA: 11/2017  Colon Screening: 12/2017  Eye Doctor: as needed  Dental Exam: biannually   Providers:   PCP: Webb Silversmith, NP-C  Gastroenterologist: Dr. Henrene Pastor     I have personally reviewed and have noted:  1. The patient's medical and social history 2. Their use of alcohol, tobacco or illicit drugs 3. Their current medications and supplements 4. The patient's functional ability including ADL's, fall risks, home safety  risks and hearing or visual impairment. 5. Diet and physical activities 6. Evidence for depression or mood disorder  Subjective:   Review of Systems:   Constitutional: Denies fever, malaise, fatigue, headache or abrupt weight changes.  HEENT: Denies eye pain, eye redness, ear pain, ringing in the ears, wax buildup, runny nose, nasal congestion, bloody nose, or sore throat. Respiratory: Denies difficulty breathing, shortness of breath, cough or sputum production.   Cardiovascular: Denies chest pain, chest tightness, palpitations or swelling in the hands or feet.  Gastrointestinal: Denies abdominal pain, bloating, constipation, diarrhea or blood in the stool.  GU: Denies urgency, frequency, pain with urination, burning sensation, blood in urine, odor or discharge. Musculoskeletal: Denies decrease in range of motion, difficulty with gait, muscle pain or joint pain and swelling.  Skin: Denies redness, rashes,  lesions or ulcercations.  Neurological: Denies dizziness, difficulty with memory, difficulty with speech or problems with balance and coordination.  Psych: Denies anxiety, depression, SI/HI.  No other specific complaints in a complete review of systems (except as listed in HPI above).  Objective:  PE:   BP 130/84   Pulse 79   Temp 97.6 F (36.4 C) (Temporal)   Ht 5' 9.5" (1.765 m)   Wt 228 lb (103.4 kg)   SpO2 98%   BMI 33.19 kg/m   Wt Readings from Last 3 Encounters:  01/18/18 230 lb (104.3 kg)  01/04/18 230 lb (104.3 kg)  11/30/17 233 lb (105.7 kg)    General: Appears his stated age, obese, in NAD. Skin: Warm, dry and intact. Multiple skin tags noted of face and neck.  HEENT: Head: normal shape and size; Eyes: sclera white, no icterus, conjunctiva pink, PERRLA and EOMs intact; Left Ear: Tm's gray and intact, normal light reflex; Right Ear: Cerumen Impaction;  Neck: Neck supple, trachea midline. No masses, lumps or thyromegaly present.  Cardiovascular: Normal rate and rhythm. S1,S2 noted.  No murmur, rubs or gallops noted. No JVD or BLE edema. No carotid bruits noted. Pulmonary/Chest: Normal effort and positive vesicular breath sounds. No respiratory distress. No wheezes, rales or ronchi noted.  Abdomen: Soft and nontender. Normal bowel sounds. No distention or masses noted. Liver, spleen and kidneys non palpable. Musculoskeletal: Strength 5/5 BUE/BLE. No signs of joint swelling.  Neurological: Alert and oriented. Cranial nerves II-XII grossly intact. Coordination normal.  Psychiatric: Mood and affect normal. Behavior is normal. Judgment and thought content normal.    BMET    Component Value Date/Time   NA 143 11/30/2017 1526   K 4.7 11/30/2017 1526   CL 105 11/30/2017 1526   CO2 31 11/30/2017 1526   GLUCOSE 98 11/30/2017 1526   BUN 20 11/30/2017 1526   CREATININE 0.99 11/30/2017 1526   CALCIUM 9.8 11/30/2017 1526   GFRNONAA >60 06/20/2016 1810   GFRAA >60  06/20/2016 1810    Lipid Panel     Component Value Date/Time   CHOL 105 03/21/2018 1006   TRIG 129.0 03/21/2018 1006   HDL 32.60 (L) 03/21/2018 1006   CHOLHDL 3 03/21/2018 1006   VLDL 25.8 03/21/2018 1006   LDLCALC 47 03/21/2018 1006    CBC    Component Value Date/Time   WBC 8.9 11/30/2017 1526   RBC 4.98 11/30/2017 1526   HGB 15.3 11/30/2017 1526   HCT 44.6 11/30/2017 1526   PLT 294.0 11/30/2017 1526   MCV 89.6 11/30/2017 1526   MCH 30.6 06/20/2016 1810   MCHC 34.4 11/30/2017 1526   RDW 13.5 11/30/2017 1526   LYMPHSABS  1.4 11/10/2014 0620   MONOABS 0.8 11/10/2014 0620   EOSABS 0.1 11/10/2014 0620   BASOSABS 0.0 11/10/2014 0620    Hgb A1C No results found for: HGBA1C    Assessment and Plan:   Medicare Annual Wellness Visit:  Diet: He does eat meat. He consumes fruits and veggies daily. He tries to avoid fried foods. He drinks mostly Dr. Malachi Bonds, some water. Physical activity: walking, cutting wood Depression/mood screen: Negative, PHQ 9 score of 0 Hearing: Intact to whispered voice Visual acuity: Grossly normal ADLs: Capable Fall risk: None Home safety: Good Cognitive evaluation: Intact to orientation, naming, recall and repetition EOL planning: No adv directives, full code/ I agree  Preventative Medicine: He declines flu shot today. Tetanus UTD. He will consider Shingrix and let me know about this. PSA today with labs. Colon screening UTD. Encouraged him to consume a balanced diet and exercise regimen. Advised him to see an eye doctor and dentist annually. Will check CBC, CMET, Lipid today. Due dates for screening exams given to patient as part of his AVS.   Next appointment: 1 year, Medicare Wellness Exam   Webb Silversmith, NP

## 2018-12-13 ENCOUNTER — Other Ambulatory Visit: Payer: Self-pay | Admitting: Internal Medicine

## 2018-12-13 DIAGNOSIS — E782 Mixed hyperlipidemia: Secondary | ICD-10-CM

## 2019-04-12 DIAGNOSIS — Z23 Encounter for immunization: Secondary | ICD-10-CM | POA: Diagnosis not present

## 2019-05-10 DIAGNOSIS — Z23 Encounter for immunization: Secondary | ICD-10-CM | POA: Diagnosis not present

## 2019-10-25 ENCOUNTER — Other Ambulatory Visit: Payer: Self-pay | Admitting: Internal Medicine

## 2019-10-25 DIAGNOSIS — E782 Mixed hyperlipidemia: Secondary | ICD-10-CM

## 2019-12-07 ENCOUNTER — Other Ambulatory Visit: Payer: Self-pay

## 2019-12-07 ENCOUNTER — Ambulatory Visit (INDEPENDENT_AMBULATORY_CARE_PROVIDER_SITE_OTHER): Payer: Medicare Other | Admitting: Internal Medicine

## 2019-12-07 ENCOUNTER — Encounter: Payer: Self-pay | Admitting: Internal Medicine

## 2019-12-07 VITALS — BP 128/82 | HR 70 | Temp 97.4°F | Ht 69.5 in | Wt 222.0 lb

## 2019-12-07 DIAGNOSIS — Z0000 Encounter for general adult medical examination without abnormal findings: Secondary | ICD-10-CM

## 2019-12-07 DIAGNOSIS — E782 Mixed hyperlipidemia: Secondary | ICD-10-CM

## 2019-12-07 DIAGNOSIS — Z125 Encounter for screening for malignant neoplasm of prostate: Secondary | ICD-10-CM | POA: Diagnosis not present

## 2019-12-07 LAB — CBC
HCT: 44.4 % (ref 39.0–52.0)
Hemoglobin: 15 g/dL (ref 13.0–17.0)
MCHC: 33.8 g/dL (ref 30.0–36.0)
MCV: 92 fl (ref 78.0–100.0)
Platelets: 290 10*3/uL (ref 150.0–400.0)
RBC: 4.82 Mil/uL (ref 4.22–5.81)
RDW: 13.1 % (ref 11.5–15.5)
WBC: 6.4 10*3/uL (ref 4.0–10.5)

## 2019-12-07 LAB — COMPREHENSIVE METABOLIC PANEL
ALT: 24 U/L (ref 0–53)
AST: 22 U/L (ref 0–37)
Albumin: 4.6 g/dL (ref 3.5–5.2)
Alkaline Phosphatase: 53 U/L (ref 39–117)
BUN: 19 mg/dL (ref 6–23)
CO2: 30 mEq/L (ref 19–32)
Calcium: 9.7 mg/dL (ref 8.4–10.5)
Chloride: 107 mEq/L (ref 96–112)
Creatinine, Ser: 1.12 mg/dL (ref 0.40–1.50)
GFR: 71.11 mL/min (ref >60.00)
Glucose, Bld: 92 mg/dL (ref 70–99)
Potassium: 4.9 mEq/L (ref 3.5–5.1)
Sodium: 142 mEq/L (ref 135–145)
Total Bilirubin: 0.8 mg/dL (ref 0.2–1.2)
Total Protein: 7.2 g/dL (ref 6.0–8.3)

## 2019-12-07 LAB — LIPID PANEL
Cholesterol: 101 mg/dL (ref 0–200)
HDL: 31.9 mg/dL — ABNORMAL LOW (ref >39.00)
LDL Cholesterol: 41 mg/dL (ref 0–99)
NonHDL: 68.66
Total CHOL/HDL Ratio: 3
Triglycerides: 140 mg/dL (ref 0.0–149.0)
VLDL: 28 mg/dL (ref 0.0–40.0)

## 2019-12-07 LAB — PSA, MEDICARE: PSA: 1.25 ng/ml (ref 0.10–4.00)

## 2019-12-07 NOTE — Progress Notes (Signed)
HPI:  Pt presents to the clinic today for his subsequent annual Medicare Wellness Exam.  OA: Generalized. He takes Ibuprofen as needed with good relief of symptoms.  HLD: His last LDL was 59, triglycerides 163, 11/2018. He denies myalgias on Rosuvastatin, Fish Oil and Fenofibrate. He tries to consume a low fat diet.  Past Medical History:  Diagnosis Date  . Arthritis   . History of transfusion   . Hyperlipidemia   . Kidney stone     Current Outpatient Medications  Medication Sig Dispense Refill  . fenofibrate 54 MG tablet Take 1 tablet by mouth once daily 30 tablet 0  . ibuprofen (ADVIL,MOTRIN) 200 MG tablet Take 400 mg by mouth every 6 (six) hours as needed for headache (pain).    . Omega 3 1200 MG CAPS Take 1 capsule by mouth daily.    . rosuvastatin (CRESTOR) 5 MG tablet Take 1 tablet by mouth once daily 30 tablet 0   No current facility-administered medications for this visit.    No Known Allergies  Family History  Problem Relation Age of Onset  . Colon cancer Mother 41  . Colon cancer Sister 24  . Colon polyps Sister   . Esophageal cancer Neg Hx   . Rectal cancer Neg Hx   . Stomach cancer Neg Hx     Social History   Socioeconomic History  . Marital status: Married    Spouse name: Not on file  . Number of children: Not on file  . Years of education: Not on file  . Highest education level: Not on file  Occupational History  . Not on file  Tobacco Use  . Smoking status: Never Smoker  . Smokeless tobacco: Never Used  Vaping Use  . Vaping Use: Never used  Substance and Sexual Activity  . Alcohol use: No  . Drug use: No  . Sexual activity: Yes  Other Topics Concern  . Not on file  Social History Narrative  . Not on file   Social Determinants of Health   Financial Resource Strain:   . Difficulty of Paying Living Expenses: Not on file  Food Insecurity:   . Worried About Charity fundraiser in the Last Year: Not on file  . Ran Out of Food in the Last  Year: Not on file  Transportation Needs:   . Lack of Transportation (Medical): Not on file  . Lack of Transportation (Non-Medical): Not on file  Physical Activity:   . Days of Exercise per Week: Not on file  . Minutes of Exercise per Session: Not on file  Stress:   . Feeling of Stress : Not on file  Social Connections:   . Frequency of Communication with Friends and Family: Not on file  . Frequency of Social Gatherings with Friends and Family: Not on file  . Attends Religious Services: Not on file  . Active Member of Clubs or Organizations: Not on file  . Attends Archivist Meetings: Not on file  . Marital Status: Not on file  Intimate Partner Violence:   . Fear of Current or Ex-Partner: Not on file  . Emotionally Abused: Not on file  . Physically Abused: Not on file  . Sexually Abused: Not on file    Hospitiliaztions: None  Health Maintenance:    Flu:  Tetanus: 02/2015  Shingrix: never  Covid: Pfizer  PSA: 11/2018  Colon Screening: 12/2017  Eye Doctor: as needed  Dental Exam: biannually   Providers:   PCP: Rollene Fare  Pheonix Clinkscale, NP-C   I have personally reviewed and have noted:  1. The patient's medical and social history 2. Their use of alcohol, tobacco or illicit drugs 3. Their current medications and supplements 4. The patient's functional ability including ADL's, fall risks, home safety risks and hearing or visual impairment. 5. Diet and physical activities 6. Evidence for depression or mood disorder  Subjective:   Review of Systems:   Constitutional: Denies fever, malaise, fatigue, headache or abrupt weight changes.  HEENT: Denies eye pain, eye redness, ear pain, ringing in the ears, wax buildup, runny nose, nasal congestion, bloody nose, or sore throat. Respiratory: Denies difficulty breathing, shortness of breath, cough or sputum production.   Cardiovascular: Denies chest pain, chest tightness, palpitations or swelling in the hands or feet.   Gastrointestinal: Denies abdominal pain, bloating, constipation, diarrhea or blood in the stool.  GU: Denies urgency, frequency, pain with urination, burning sensation, blood in urine, odor or discharge. Musculoskeletal: Pt reports joint pain. Denies decrease in range of motion, difficulty with gait, muscle pain or joint swelling.  Skin: Denies redness, rashes, lesions or ulcercations.  Neurological: Denies dizziness, difficulty with memory, difficulty with speech or problems with balance and coordination.  Psych: Denies anxiety, depression, SI/HI.  No other specific complaints in a complete review of systems (except as listed in HPI above).  Objective:  PE:   BP 128/82   Pulse 70   Temp (!) 97.4 F (36.3 C) (Temporal)   Ht 5' 9.5" (1.765 m)   Wt 222 lb (100.7 kg)   SpO2 98%   BMI 32.31 kg/m   Wt Readings from Last 3 Encounters:  12/05/18 228 lb (103.4 kg)  01/18/18 230 lb (104.3 kg)  01/04/18 230 lb (104.3 kg)    General: Appears his stated age, obese, in NAD. Skin: Warm, dry and intact. Multiple skin tags noted of neck. HEENT: Head: normal shape and size; Eyes: sclera white, no icterus, conjunctiva pink, PERRLA and EOMs intact;  Neck: Neck supple, trachea midline. No masses, lumps or thyromegaly present.  Cardiovascular: Normal rate and rhythm. S1,S2 noted.  No murmur, rubs or gallops noted. No JVD or BLE edema. No carotid bruits noted. Pulmonary/Chest: Normal effort and positive vesicular breath sounds. No respiratory distress. No wheezes, rales or ronchi noted.  Abdomen: Soft and nontender. Normal bowel sounds. No distention or masses noted. Liver, spleen and kidneys non palpable. Musculoskeletal: Strength 5/5 BUE/BLE. No difficulty with gait. Neurological: Alert and oriented. Cranial nerves II-XII grossly intact. Coordination normal.  Psychiatric: Mood and affect normal. Behavior is normal. Judgment and thought content normal.    BMET    Component Value Date/Time    NA 142 12/05/2018 0837   K 4.3 12/05/2018 0837   CL 108 12/05/2018 0837   CO2 25 12/05/2018 0837   GLUCOSE 99 12/05/2018 0837   BUN 17 12/05/2018 0837   CREATININE 1.05 12/05/2018 0837   CALCIUM 9.4 12/05/2018 0837   GFRNONAA >60 06/20/2016 1810   GFRAA >60 06/20/2016 1810    Lipid Panel     Component Value Date/Time   CHOL 121 12/05/2018 0837   TRIG 163.0 (H) 12/05/2018 0837   HDL 29.50 (L) 12/05/2018 0837   CHOLHDL 4 12/05/2018 0837   VLDL 32.6 12/05/2018 0837   LDLCALC 59 12/05/2018 0837    CBC    Component Value Date/Time   WBC 6.3 12/05/2018 0837   RBC 4.90 12/05/2018 0837   HGB 15.0 12/05/2018 0837   HCT 45.0 12/05/2018 0837  PLT 286.0 12/05/2018 0837   MCV 91.8 12/05/2018 0837   MCH 30.6 06/20/2016 1810   MCHC 33.4 12/05/2018 0837   RDW 13.7 12/05/2018 0837   LYMPHSABS 1.4 11/10/2014 0620   MONOABS 0.8 11/10/2014 0620   EOSABS 0.1 11/10/2014 0620   BASOSABS 0.0 11/10/2014 0620    Hgb A1C No results found for: HGBA1C    Assessment and Plan:   Medicare Annual Wellness Visit:  Diet: He does eat lean meat. He consumes fruits and veggies daily. He tries to avoid fried foods. He drinks mostly juice, soda. Physical activity: Walking Depression/mood screen: Negative, PHQ 9 score of 0 Hearing: Intact to whispered voice Visual acuity: Grossly normal, performs annual eye exam  ADLs: Capable Fall risk: None Home safety: Good Cognitive evaluation: Intact to orientation, naming, recall and repetition EOL planning: Adv directives, full code/ I agree  Preventative Medicine: He declines flu shot. Tetanus UTD. He declines Shingrix vaccine at this time. Colon screening due 2022. Encouraged him to consume a balanced diet and exercise regimen. Advised him to see an eye doctor an dentist annually. Will check CBC, CMET, Lipid and PSA today. Due dates for screening exam given to patient as part of his AVS.   Next appointment: 1 year, Medicare Wellness Exam   Webb Silversmith, NP This visit occurred during the SARS-CoV-2 public health emergency.  Safety protocols were in place, including screening questions prior to the visit, additional usage of staff PPE, and extensive cleaning of exam room while observing appropriate contact time as indicated for disinfecting solutions.

## 2019-12-07 NOTE — Assessment & Plan Note (Signed)
CMET and Lipid profile today Encouraged him to consume a low fat diet Continue Rosuvastatin, Fish Oil and Fenofibrate

## 2019-12-07 NOTE — Patient Instructions (Signed)

## 2020-02-02 ENCOUNTER — Emergency Department (HOSPITAL_COMMUNITY): Payer: Medicare Other

## 2020-02-02 ENCOUNTER — Other Ambulatory Visit: Payer: Self-pay

## 2020-02-02 ENCOUNTER — Emergency Department (HOSPITAL_COMMUNITY)
Admission: EM | Admit: 2020-02-02 | Discharge: 2020-02-02 | Disposition: A | Discharge status: Home / Self Care | Payer: Medicare Other | Attending: Emergency Medicine | Admitting: Emergency Medicine

## 2020-02-02 ENCOUNTER — Encounter (HOSPITAL_COMMUNITY): Payer: Self-pay

## 2020-02-02 DIAGNOSIS — N201 Calculus of ureter: Secondary | ICD-10-CM

## 2020-02-02 DIAGNOSIS — N200 Calculus of kidney: Secondary | ICD-10-CM | POA: Diagnosis not present

## 2020-02-02 DIAGNOSIS — N132 Hydronephrosis with renal and ureteral calculous obstruction: Secondary | ICD-10-CM | POA: Insufficient documentation

## 2020-02-02 DIAGNOSIS — K7689 Other specified diseases of liver: Secondary | ICD-10-CM | POA: Diagnosis not present

## 2020-02-02 DIAGNOSIS — R109 Unspecified abdominal pain: Secondary | ICD-10-CM | POA: Diagnosis present

## 2020-02-02 DIAGNOSIS — K8020 Calculus of gallbladder without cholecystitis without obstruction: Secondary | ICD-10-CM | POA: Diagnosis not present

## 2020-02-02 DIAGNOSIS — K760 Fatty (change of) liver, not elsewhere classified: Secondary | ICD-10-CM | POA: Diagnosis not present

## 2020-02-02 LAB — CBC
HCT: 49.4 % (ref 39.0–52.0)
Hemoglobin: 16.4 g/dL (ref 13.0–17.0)
MCH: 31.2 pg (ref 26.0–34.0)
MCHC: 33.2 g/dL (ref 30.0–36.0)
MCV: 94.1 fL (ref 80.0–100.0)
Platelets: 278 10*3/uL (ref 150–400)
RBC: 5.25 MIL/uL (ref 4.22–5.81)
RDW: 13 % (ref 11.5–15.5)
WBC: 17.6 10*3/uL — ABNORMAL HIGH (ref 4.0–10.5)
nRBC: 0 % (ref 0.0–0.2)

## 2020-02-02 LAB — COMPREHENSIVE METABOLIC PANEL
ALT: 31 U/L (ref 0–44)
AST: 25 U/L (ref 15–41)
Albumin: 4.6 g/dL (ref 3.5–5.0)
Alkaline Phosphatase: 59 U/L (ref 38–126)
Anion gap: 11 (ref 5–15)
BUN: 24 mg/dL — ABNORMAL HIGH (ref 8–23)
CO2: 22 mmol/L (ref 22–32)
Calcium: 9.3 mg/dL (ref 8.9–10.3)
Chloride: 107 mmol/L (ref 98–111)
Creatinine, Ser: 1.44 mg/dL — ABNORMAL HIGH (ref 0.61–1.24)
GFR, Estimated: 55 mL/min — ABNORMAL LOW (ref >60)
Glucose, Bld: 131 mg/dL — ABNORMAL HIGH (ref 70–99)
Potassium: 3.9 mmol/L (ref 3.5–5.1)
Sodium: 140 mmol/L (ref 135–145)
Total Bilirubin: 1.3 mg/dL — ABNORMAL HIGH (ref 0.3–1.2)
Total Protein: 7.9 g/dL (ref 6.5–8.1)

## 2020-02-02 LAB — URINALYSIS, ROUTINE W REFLEX MICROSCOPIC
Bilirubin Urine: NEGATIVE
Glucose, UA: NEGATIVE mg/dL
Ketones, ur: NEGATIVE mg/dL
Leukocytes,Ua: NEGATIVE
Nitrite: NEGATIVE
Protein, ur: NEGATIVE mg/dL
Specific Gravity, Urine: 1.019 (ref 1.005–1.030)
pH: 5 (ref 5.0–8.0)

## 2020-02-02 LAB — LIPASE, BLOOD: Lipase: 21 U/L (ref 11–51)

## 2020-02-02 MED ORDER — SODIUM CHLORIDE 0.9 % IV SOLN: INTRAVENOUS | Status: DC

## 2020-02-02 MED ORDER — HYDROCODONE-ACETAMINOPHEN 5-325 MG PO TABS: 1.0000 | ORAL_TABLET | Freq: Four times a day (QID) | ORAL | 0 refills | Status: AC | PRN

## 2020-02-02 MED ORDER — SODIUM CHLORIDE 0.9 % IV BOLUS
1000.0000 mL | Freq: Once | INTRAVENOUS | Status: AC
  Administered 2020-02-02: 1000 mL via INTRAVENOUS

## 2020-02-02 MED ORDER — ONDANSETRON 8 MG PO TBDP
8.0000 mg | ORAL_TABLET | Freq: Three times a day (TID) | ORAL | 0 refills | Status: AC | PRN
Start: 2020-02-02 — End: ?

## 2020-02-02 MED ORDER — ONDANSETRON HCL 4 MG/2ML IJ SOLN
4.0000 mg | Freq: Once | INTRAMUSCULAR | Status: AC
  Administered 2020-02-02: 4 mg via INTRAVENOUS
  Filled 2020-02-02: qty 2

## 2020-02-02 MED ORDER — HYDROMORPHONE HCL 1 MG/ML IJ SOLN
1.0000 mg | INTRAMUSCULAR | Status: DC | PRN
  Administered 2020-02-02: 1 mg via INTRAVENOUS
  Filled 2020-02-02: qty 1

## 2020-02-02 NOTE — ED Provider Notes (Signed)
[facility] DEPT Provider Note   CSN: [id number] Arrival date & time: 02/02/20  1741     History Chief Complaint  Patient presents with  . Flank Pain  . Abdominal Pain    Walter Koch is a 60 y.o. male.  HPI   Patient presented to the ED for evaluation of persistent left-sided flank pain.  Patient states the symptoms started early this morning.  Patient started having severe sharp pain in the left flank.  It radiates towards the front of his abdomen.  He has a history of prior kidney stones and this feels very similar.  He has had nausea and vomiting.  He denies any fevers or chills.  No abdominal distention.  Past Medical History:  Diagnosis Date  . Arthritis   . History of transfusion   . Hyperlipidemia   . Kidney stone     Patient Active Problem List   Diagnosis Date Noted  . Mixed hyperlipidemia 12/07/2017    Past Surgical History:  Procedure Laterality Date  . broken pelvis surgery      pin in middle to connect both sides   . CYSTOSCOPY WITH RETROGRADE PYELOGRAM, URETEROSCOPY AND STENT PLACEMENT Right 11/15/2014   Procedure: CYSTOSCOPY WITH RETROGRADE PYELOGRAM, URETEROSCOPY AND STENT PLACEMENT right side basket extraction stone dilation of urethra;  Surgeon: Alexis Frock, MD;  Location: [facility];  Service: Urology;  Laterality: Right;  . CYSTOSCOPY WITH RETROGRADE PYELOGRAM, URETEROSCOPY AND STENT PLACEMENT Right 01/02/2015   Procedure: CYSTOSCOPY WITH RIGHT RETROGRADE PYELOGRAM, URETEROSCOPY AND STENT EXCHANGE;  Surgeon: Alexis Frock, MD;  Location: [facility];  Service: Urology;  Laterality: Right;  . HOLMIUM LASER APPLICATION Right [id number]   Procedure: HOLMIUM LASER APPLICATION;  Surgeon: Alexis Frock, MD;  Location: [facility];  Service: Urology;  Laterality: Right;  . left heel sugery      to remove screws   . left heel surgery     . right arm fracture srugery     . right wrist surgery     . ROBOT ASSISTED LAPAROSCOPIC  RADICAL PROSTATECTOMY N/A 01/02/2015   Procedure: ROBOTIC ASSISTED LAPAROSCOPIC CYSTOLITHALOPAXY   ;  Surgeon: Alexis Frock, MD;  Location: [facility];  Service: Urology;  Laterality: N/A;       Family History  Problem Relation Age of Onset  . Colon cancer Mother 25  . Colon cancer Sister 52  . Colon polyps Sister   . Esophageal cancer Neg Hx   . Rectal cancer Neg Hx   . Stomach cancer Neg Hx     Social History   Tobacco Use  . Smoking status: Never Smoker  . Smokeless tobacco: Never Used  Vaping Use  . Vaping Use: Never used  Substance Use Topics  . Alcohol use: No  . Drug use: No    Home Medications Prior to Admission medications   Medication Sig Start Date End Date Taking? Authorizing Provider  fenofibrate 54 MG tablet Take 1 tablet by mouth once daily Patient taking differently: Take 54 mg by mouth daily. 10/26/19  Yes Baity, Coralie Keens, NP  HYDROcodone-acetaminophen (NORCO/VICODIN) 5-325 MG tablet Take 1 tablet by mouth every 6 (six) hours as needed. 02/02/20  Yes Dorie Rank, MD  ibuprofen (ADVIL,MOTRIN) 200 MG tablet Take 400 mg by mouth every 6 (six) hours as needed for headache (pain).   Yes [provider]  Omega 3 1200 MG CAPS Take 1 capsule by mouth daily.   Yes [provider]  ondansetron (ZOFRAN ODT) 8  MG disintegrating tablet Take 1 tablet (8 mg total) by mouth every 8 (eight) hours as needed for nausea or vomiting. 02/02/20  Yes Dorie Rank, MD  rosuvastatin (CRESTOR) 5 MG tablet Take 1 tablet by mouth once daily Patient taking differently: Take 5 mg by mouth daily. 10/26/19  Yes Jearld Fenton, NP    Allergies    Patient has no known allergies.  Review of Systems   Review of Systems  All other systems reviewed and are negative.   Physical Exam Updated Vital Signs BP 138/89 (BP Location: Left Arm)   Pulse 88   Temp 98 F (36.7 C) (Oral)   Resp 16   SpO2 94%   Physical Exam Vitals and nursing note reviewed.  Constitutional:       General: He is not in acute distress.    Appearance: He is well-developed and well-nourished.  HENT:     Head: Normocephalic and atraumatic.     Right Ear: External ear normal.     Left Ear: External ear normal.  Eyes:     General: No scleral icterus.       Right eye: No discharge.        Left eye: No discharge.     Conjunctiva/sclera: Conjunctivae normal.  Neck:     Trachea: No tracheal deviation.  Cardiovascular:     Rate and Rhythm: Normal rate and regular rhythm.     Pulses: Intact distal pulses.  Pulmonary:     Effort: Pulmonary effort is normal. No respiratory distress.     Breath sounds: Normal breath sounds. No stridor. No wheezing or rales.  Abdominal:     General: Bowel sounds are normal. There is no distension.     Palpations: Abdomen is soft.     Tenderness: There is no abdominal tenderness. There is left CVA tenderness. There is no guarding or rebound.  Musculoskeletal:        General: No tenderness or edema.     Cervical back: Neck supple.  Skin:    General: Skin is warm and dry.     Findings: No rash.  Neurological:     Mental Status: He is alert.     Cranial Nerves: No cranial nerve deficit (no facial droop, extraocular movements intact, no slurred speech).     Sensory: No sensory deficit.     Motor: No abnormal muscle tone or seizure activity.     Coordination: Coordination normal.     Deep Tendon Reflexes: Strength normal.  Psychiatric:        Mood and Affect: Mood and affect normal.     ED Results / Procedures / Treatments   Labs (all labs ordered are listed, but only abnormal results are displayed) Labs Reviewed  COMPREHENSIVE METABOLIC PANEL - Abnormal; Notable for the following components:      Result Value   Glucose, Bld 131 (*)    BUN 24 (*)    Creatinine, Ser 1.44 (*)    Total Bilirubin 1.3 (*)    GFR, Estimated 55 (*)    All other components within normal limits  CBC - Abnormal; Notable for the following components:   WBC 17.6 (*)     All other components within normal limits  URINALYSIS, ROUTINE W REFLEX MICROSCOPIC - Abnormal; Notable for the following components:   APPearance HAZY (*)    Hgb urine dipstick LARGE (*)    Bacteria, UA RARE (*)    All other components within normal limits  URINE CULTURE  LIPASE, BLOOD    EKG None  Radiology CT Renal Stone Study  Result Date: 02/02/2020 CLINICAL DATA:  Flank pain stone disease suspected, left-sided pain that radiates to the abdomen EXAM: CT ABDOMEN AND PELVIS WITHOUT CONTRAST TECHNIQUE: Multidetector CT imaging of the abdomen and pelvis was performed following the standard protocol without IV contrast. COMPARISON:  CT abdomen pelvis June 20, 2016. FINDINGS: Lower chest: Linear opacities in the bilateral lower lobes with volume loss, favor atelectasis. Coronary artery calcifications. Hepatobiliary: Mild diffuse hepatic steatosis. Multiple hepatic cyst again noted. Largest gallstone again noted in the neck of the gallbladder measuring 1.9 cm with mild biliary distension measuring up to 3.8 cm. No biliary ductal dilation. Pancreas: Unremarkable. No pancreatic ductal dilatation or surrounding inflammatory changes. Spleen: Capsular calcifications along posterior aspect of the spleen unchanged from prior. Adrenals/Urinary Tract: Adrenal glands are normal. There is a 2 mm stone at the left UVJ (series 2, image 90) with upstream hydroureteronephrosis, as well as Peri ureteral and perinephric stranding. Nonobstructive 2 mm stone in the interpolar region of the left kidney. No right sided hydronephrosis or nephrolithiasis. Stomach/Bowel: Stomach is within normal limits. Appendix appears normal. No evidence of bowel wall thickening, distention, or inflammatory changes. Vascular/Lymphatic: No significant vascular findings are present. IVC filter remains in place. No enlarged abdominal or pelvic lymph nodes. Reproductive: Prosthetic calcifications. Other: None. Musculoskeletal: No acute fractures  or suspicious bone lesions identified. Remote bilateral pubic rami fractures again seen. Fixation screw across the right sacroiliac joint. 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 IMPRESSION: 1. Stone in the left ureterovesicular junction measuring 2 mm, with upstream hydroureteronephrosis as well as periureteric and perinephric stranding. 2. Distended gallbladder with a 1.9 cm stone near the neck of the gallbladder, recommend correlation with right upper quadrant tenderness to palpation. 3. Similar diffuse hepatic steatosis and benign appearing hepatic cyst. These results were called by telephone at the time of interpretation on 02/02/2020 at 7:56 pm to provider [facility] , who verbally acknowledged these results. Electronically Signed   By: Dahlia Bailiff MD   On: 02/02/2020 20:07    Procedures Procedures (including critical care time)  Medications Ordered in ED Medications  sodium chloride 0.9 % bolus 1,000 mL (0 mLs Intravenous Stopped 02/02/20 2045)    And  0.9 %  sodium chloride infusion ( Intravenous Stopped 02/02/20 2127)  HYDROmorphone (DILAUDID) injection 1 mg (1 mg Intravenous Given 02/02/20 1855)  ondansetron (ZOFRAN) injection 4 mg (4 mg Intravenous Given 02/02/20 1855)    ED Course  I have reviewed the triage vital signs and the nursing notes.  Pertinent labs & imaging results that were available during my care of the patient were reviewed by me and considered in my medical decision making (see chart for details).  Clinical Course as of 02/02/20 2218  Fri Feb 02, 2020  2032 CT scan shows incidental gallstone.  Patient also has left-sided ureteral stone [JK]  2217 Urine shows 11-20 reds 11-20 whites rare bacteria.  Doubt infection [JK]    Clinical Course User Index [JK] Dorie Rank, MD   MDM Rules/Calculators/A&P                          Patient presents to the ED for evaluation of flank pain. Patient has history of kidney stones and felt  was having a similar episode. She was not having any pain in his upper abdomen. Laboratory tests are reassuring. No signs of pancreatitis. No  signs of hepatitis. Elevated BUN and creatinine most likely related to his kidney stone. Patient does have an elevated white blood cell count but I suspect this is related to the pain as well. Patient did have incidental gallstone in the right upper quadrant. Patient is not having any tenderness in the right upper quadrant. I doubt acute cholecystitis. Discussed outpatient follow-up with general surgery. Recommend outpatient follow-up with urology. Will discharge home with prescriptions for pain and nausea Final Clinical Impression(s) / ED Diagnoses Final diagnoses:  Ureteral stone  Calculus of gallbladder without cholecystitis without obstruction    Rx / DC Orders ED Discharge Orders         Ordered    HYDROcodone-acetaminophen (NORCO/VICODIN) 5-325 MG tablet  Every 6 hours PRN        02/02/20 2136    ondansetron (ZOFRAN ODT) 8 MG disintegrating tablet  Every 8 hours PRN        02/02/20 2136           Dorie Rank, MD 02/02/20 2218

## 2020-02-02 NOTE — Discharge Instructions (Signed)
Take the medications as needed for pain. Follow-up with your urologist if the kidney stone does not pass. As we discussed you also have a gallstone. This could cause intermittent pain in the right upper part of your abdomen. Consider following up with a general surgeon if you start having episodes of pain associated with eating in that area.

## 2020-02-02 NOTE — ED Triage Notes (Signed)
Pt reports left sided flank pain that radiates to abdomen. Pt endorses nausea and an episode of vomiting today. Pt reports hx of kidney stones.

## 2020-02-02 NOTE — ED Notes (Signed)
Ct at pt bedside for transport to radiology

## 2020-02-07 LAB — URINE CULTURE: Culture: NO GROWTH

## 2020-03-31 ENCOUNTER — Other Ambulatory Visit: Payer: Self-pay | Admitting: Internal Medicine

## 2020-03-31 DIAGNOSIS — E782 Mixed hyperlipidemia: Secondary | ICD-10-CM

## 2020-09-26 ENCOUNTER — Other Ambulatory Visit: Payer: Self-pay | Admitting: Internal Medicine

## 2020-09-26 DIAGNOSIS — E782 Mixed hyperlipidemia: Secondary | ICD-10-CM

## 2020-09-26 NOTE — Telephone Encounter (Signed)
Requested medication (s) are due for refill today - yes  Requested medication (s) are on the active medication list -yes  Future visit scheduled -no  Last refill: 06/29/20  Notes to clinic: Attempted to call patient to see what his plans are for continued care- left message on VM to call back. Refill request forwarded to office for review.  Requested Prescriptions  Pending Prescriptions Disp Refills   fenofibrate 54 MG tablet [Pharmacy Med Name: Fenofibrate 54 MG Oral Tablet] 90 tablet 0    Sig: Take 1 tablet by mouth once daily     There is no refill protocol information for this order     rosuvastatin (CRESTOR) 5 MG tablet [Pharmacy Med Name: Rosuvastatin Calcium 5 MG Oral Tablet] 90 tablet 0    Sig: Take 1 tablet by mouth once daily     There is no refill protocol information for this order       Requested Prescriptions  Pending Prescriptions Disp Refills   fenofibrate 54 MG tablet [Pharmacy Med Name: Fenofibrate 54 MG Oral Tablet] 90 tablet 0    Sig: Take 1 tablet by mouth once daily     There is no refill protocol information for this order     rosuvastatin (CRESTOR) 5 MG tablet [Pharmacy Med Name: Rosuvastatin Calcium 5 MG Oral Tablet] 90 tablet 0    Sig: Take 1 tablet by mouth once daily     There is no refill protocol information for this order

## 2020-09-30 ENCOUNTER — Telehealth: Payer: Self-pay

## 2020-09-30 NOTE — Telephone Encounter (Signed)
Copied from [place] [id number]. Topic: General - Inquiry >> Sep 30, 2020  9:58 AM Greggory Keen D wrote: Reason for CRM: Pt saw Webb Silversmith at [facility] and plans to continue care with her at [facility]

## 2020-12-02 ENCOUNTER — Encounter: Payer: Self-pay | Admitting: Internal Medicine

## 2020-12-06 ENCOUNTER — Ambulatory Visit: Payer: Medicare Other | Admitting: Internal Medicine

## 2020-12-25 ENCOUNTER — Other Ambulatory Visit: Payer: Self-pay | Admitting: Internal Medicine

## 2020-12-25 DIAGNOSIS — E782 Mixed hyperlipidemia: Secondary | ICD-10-CM

## 2020-12-25 NOTE — Telephone Encounter (Signed)
Requested medication (s) are due for refill today: Yes  Requested medication (s) are on the active medication list: Yes  Last refill:  09/26/20 #90/0RF  Future visit scheduled: no  Notes to clinic:  Unable to refill per protocol, medication not assigned to the refill protocol.      Requested Prescriptions  Pending Prescriptions Disp Refills   fenofibrate 54 MG tablet [Pharmacy Med Name: Fenofibrate 54 MG Oral Tablet] 90 tablet 0    Sig: Take 1 tablet by mouth once daily     There is no refill protocol information for this order     rosuvastatin (CRESTOR) 5 MG tablet [Pharmacy Med Name: Rosuvastatin Calcium 5 MG Oral Tablet] 90 tablet 0    Sig: Take 1 tablet by mouth once daily     There is no refill protocol information for this order

## 2020-12-25 NOTE — Telephone Encounter (Signed)
Last office visit 12/07/19 No upcoming appointment scheduled

## 2021-03-25 ENCOUNTER — Other Ambulatory Visit: Payer: Self-pay | Admitting: Family

## 2021-03-25 DIAGNOSIS — E782 Mixed hyperlipidemia: Secondary | ICD-10-CM
